# Patient Record
Sex: Female | Born: 1970 | Race: White | Hispanic: No | Marital: Married | Smoking: Never smoker
Health system: Southern US, Community
[De-identification: ages and names within clinical notes are randomized; demographics above are authoritative.]

## PROBLEM LIST (undated history)

## (undated) DIAGNOSIS — K219 Gastro-esophageal reflux disease without esophagitis: Secondary | ICD-10-CM

## (undated) DIAGNOSIS — T7840XA Allergy, unspecified, initial encounter: Secondary | ICD-10-CM

## (undated) DIAGNOSIS — F419 Anxiety disorder, unspecified: Secondary | ICD-10-CM

## (undated) HISTORY — DX: Anxiety disorder, unspecified: F41.9

## (undated) HISTORY — DX: Allergy, unspecified, initial encounter: T78.40XA

## (undated) HISTORY — DX: Gastro-esophageal reflux disease without esophagitis: K21.9

---

## 2021-06-20 ENCOUNTER — Encounter: Payer: Self-pay | Admitting: Family

## 2021-06-20 ENCOUNTER — Other Ambulatory Visit: Payer: Self-pay

## 2021-06-20 ENCOUNTER — Ambulatory Visit: Payer: BC Managed Care – PPO | Admitting: Family

## 2021-06-20 VITALS — BP 139/58 | HR 68 | Temp 98.3°F | Ht 65.75 in | Wt 134.2 lb

## 2021-06-20 DIAGNOSIS — F418 Other specified anxiety disorders: Secondary | ICD-10-CM

## 2021-06-20 DIAGNOSIS — Z1211 Encounter for screening for malignant neoplasm of colon: Secondary | ICD-10-CM

## 2021-06-20 DIAGNOSIS — Z1231 Encounter for screening mammogram for malignant neoplasm of breast: Secondary | ICD-10-CM | POA: Diagnosis not present

## 2021-06-20 DIAGNOSIS — Z79890 Hormone replacement therapy (postmenopausal): Secondary | ICD-10-CM | POA: Insufficient documentation

## 2021-06-20 DIAGNOSIS — N951 Menopausal and female climacteric states: Secondary | ICD-10-CM

## 2021-06-20 DIAGNOSIS — M778 Other enthesopathies, not elsewhere classified: Secondary | ICD-10-CM

## 2021-06-20 DIAGNOSIS — Z23 Encounter for immunization: Secondary | ICD-10-CM

## 2021-06-20 MED ORDER — LORAZEPAM 0.5 MG PO TABS: 0.5000 mg | ORAL_TABLET | Freq: Three times a day (TID) | ORAL | 0 refills | Status: DC

## 2021-06-20 NOTE — Patient Instructions (Signed)
Welcome to [facility]! It was a pleasure meeting you today.  As discussed, Please schedule a physical with fasting labs at your convenience today. See the attached handout on tips to manage your menopause symptoms and elbow tendonitis. Look for a tendon strap at your pharmacy to apply just below the elbow and wear this during the day to take pressure off of the tendon. Apply ice up to 3 times per day for 20-58min at a time. Take generic Aleve or Ibuprofen as needed.    PLEASE NOTE:  If you had any LAB tests please let us know if you have not heard back within a few days. You may see your results on MyChart before we have a chance to review them but we will give you a call once they are reviewed by [place]. If we ordered any REFERRALS today, please let us know if you have not heard from their office within the next week.  Let us know through MyChart if you are needing REFILLS, or have your pharmacy send [place] the request. You can also use MyChart to communicate with me or any office staff.  Please try these tips to maintain a healthy lifestyle:  Eat most of your calories during the day when you are active. Eliminate processed foods including packaged sweets (pies, cakes, cookies), reduce intake of potatoes, white bread, white pasta, and white rice. Look for whole grain options, oat flour or almond flour.  Each meal should contain half fruits/vegetables, one quarter protein, and one quarter carbs (no bigger than a computer mouse).  Cut down on sweet beverages. This includes juice, soda, and sweet tea. Also watch fruit intake, though this is a healthier sweet option, it still contains natural sugar! Limit to 3 servings daily.  Drink at least 1 glass of water with each meal and aim for at least 8 glasses per day  Exercise at least 150 minutes every week.

## 2021-06-20 NOTE — Progress Notes (Signed)
New Patient Office Visit  Subjective:  Patient ID: Carolyn Ferrell, female    DOB: 08-12-1970  Age: 51 y.o. MRN: [id number]  CC:  Chief Complaint  Patient presents with   Establish Care   Menopause    Irregular cycles, night sweats, dizziness. LMP -02/03/2021   Arm Pain    HPI [facility] presents for establishing care and to discuss a new problem.  Menopausal SX:   The patient is not taking hormone replacement therapy.   last mammogram: was normal. Patient is hormone deficient due to perimenopause.  The patient currently has symptoms of hot flashes, night sweats. OTC/herbal treatments tried: none. Pain She reports new onset left elbow pain. There was not an injury that may have caused the pain. The pain started a few months ago and is staying constant. The pain does not radiate. The pain is described as aching and soreness. Symptoms are worse in the: anytime  She has tried NSAIDs with mild relief.  Anxiety/Depression: Patient complains of situational anxiety when flying on airplanes.   She has the following symptoms: feelings of losing control, palpitations, racing thoughts.  Onset of symptoms was approximately  years ago, She denies current suicidal and homicidal ideation.  Previous treatment includes Ativan.  She complains of the following side effects from the treatment:  drowsiness . Depression screen [facility] 2/9 06/20/2021  Decreased Interest 0  Down, Depressed, Hopeless 0  PHQ - 2 Score 0  Altered sleeping 1  Tired, decreased energy 1  Change in appetite 0  Feeling bad or failure about yourself  0  Trouble concentrating 0  Moving slowly or fidgety/restless 0  Suicidal thoughts 0  PHQ-9 Score 2  Difficult doing work/chores Not difficult at all   GAD 7 : Generalized Anxiety Score 06/20/2021  Nervous, Anxious, on Edge 1  Control/stop worrying 0  Worry too much - different things 0  Trouble relaxing 0  Restless 0  Easily annoyed or irritable 1  Afraid - awful  might happen 0  Total GAD 7 Score 2  Anxiety Difficulty Not difficult at all    Past Medical History:  Diagnosis Date   Allergy    GERD (gastroesophageal reflux disease)     History reviewed. No pertinent surgical history.  Family History  Problem Relation Age of Onset   Stroke Mother    Arthritis Mother    Hearing loss Father    Drug abuse Father    Asthma Sister    Mental illness Sister    Drug abuse Brother    Depression Brother    Hyperlipidemia Maternal Grandmother    Heart disease Maternal Grandmother    Early death Maternal Grandmother    Cancer Maternal Grandfather    Hearing loss Paternal Grandmother    Early death Paternal Grandfather    Emphysema Paternal Grandfather     Social History   Socioeconomic History   Marital status: Married    Spouse name: Not on file   Number of children: Not on file   Years of education: Not on file   Highest education level: Not on file  Occupational History   Not on file  Tobacco Use   Smoking status: Not on file   Smokeless tobacco: Not on file  Substance and Sexual Activity   Alcohol use: Not on file   Drug use: Not on file   Sexual activity: Not on file  Other Topics Concern   Not on file  Social History Narrative  Not on file   Social Determinants of Health   Financial Resource Strain: Not on file  Food Insecurity: Not on file  Transportation Needs: Not on file  Physical Activity: Not on file  Stress: Not on file  Social Connections: Not on file  Intimate Partner Violence: Not on file    Objective:   Today's Vitals: BP (!) 139/58    Pulse 68    Temp 98.3 F (36.8 C) (Temporal)    Ht 5' 5.75" (1.67 m) Comment: with shoes   Wt 134 lb 3.2 oz (60.9 kg)    LMP 02/03/2021 (Exact Date)    SpO2 98%    BMI 21.83 kg/m   Physical Exam Vitals and nursing note reviewed.  Constitutional:      Appearance: Normal appearance.  Cardiovascular:     Rate and Rhythm: Normal rate and regular rhythm.  Pulmonary:      Effort: Pulmonary effort is normal.     Breath sounds: Normal breath sounds.  Musculoskeletal:        General: Normal range of motion.  Skin:    General: Skin is warm and dry.  Neurological:     Mental Status: She is alert.  Psychiatric:        Mood and Affect: Mood normal.        Behavior: Behavior normal.   Assessment & Plan:   Problem List Items Addressed This Visit       Musculoskeletal and Integument   Tendonitis of elbow, left    New- no injury, no sports, does not workout, right handed; states she does open/close car door frequently which heavy. Advised on ice tid, ., tendon strap during day, reviewed exercises and handout provided.        Other   Perimenopausal symptom - Primary    reports no menses since 01/2021, states off & on hot flashes & night sweats. Advised pt menopause is 12 mos of no menses, treatment options including OTC black cohosh, Estroven, vs. RX Clonidine, SSRI. pt will try OTC first.      Situational anxiety    Chronic - takes prn when flying      Relevant Medications   LORazepam (ATIVAN) 0.5 MG tablet   Other Visit Diagnoses     Colon cancer screening       Relevant Orders   Ambulatory referral to Gastroenterology   Breast cancer screening by mammogram       Relevant Orders   MM Digital Screening   Need for shingles vaccine       Relevant Orders   Varicella-zoster vaccine IM (Completed)       Outpatient Encounter Medications as of 06/20/2021  Medication Sig   Bacillus Coagulans-Inulin (PROBIOTIC-PREBIOTIC PO) Take by mouth. Gummy   cetirizine (ZYRTEC) 10 MG tablet Take 10 mg by mouth daily.   Misc Natural Products (IMMUNE FORMULA PO) Take by mouth. W/ Elderberry  and Vitamin C   Multiple Vitamin (MULTIVITAMIN) tablet Take 1 tablet by mouth daily.   Multiple Vitamins-Minerals (ENERGY BOOSTER PO) Take by mouth. W/B12 and CoQ10   omeprazole (PRILOSEC) 10 MG capsule Take 10 mg by mouth daily.   [DISCONTINUED] LORazepam (ATIVAN) 0.5  MG tablet Take 0.5 mg by mouth every 8 (eight) hours.   LORazepam (ATIVAN) 0.5 MG tablet Take 1 tablet (0.5 mg total) by mouth every 8 (eight) hours.   No facility-administered encounter medications on file as of 06/20/2021.    Follow-up: Return for Complete physical w/fasting labs.   Shonique Pelphrey,  Judeth Cornfield, NP

## 2021-06-22 ENCOUNTER — Encounter: Payer: Self-pay | Admitting: Family

## 2021-06-22 NOTE — Assessment & Plan Note (Addendum)
New- no injury, no sports, does not workout, right handed; states she does open/close car door frequently which heavy. Advised on ice tid, ., tendon strap during day, reviewed exercises and handout provided.

## 2021-06-22 NOTE — Assessment & Plan Note (Signed)
reports no menses since 01/2021, states off & on hot flashes & night sweats. Advised pt menopause is 12 mos of no menses, treatment options including OTC black cohosh, Estroven, vs. RX Clonidine, SSRI. pt will try OTC first.

## 2021-06-22 NOTE — Assessment & Plan Note (Signed)
Chronic - takes prn when flying

## 2021-08-13 ENCOUNTER — Ambulatory Visit
Admission: RE | Admit: 2021-08-13 | Discharge: 2021-08-13 | Disposition: A | Discharge status: Home / Self Care | Payer: BC Managed Care – PPO | Source: Ambulatory Visit | Attending: Family | Admitting: Family

## 2021-08-13 DIAGNOSIS — Z1231 Encounter for screening mammogram for malignant neoplasm of breast: Secondary | ICD-10-CM

## 2021-09-02 NOTE — Progress Notes (Signed)
Mammogram normal! Recheck in 1 year.

## 2021-10-10 ENCOUNTER — Encounter: Payer: Self-pay | Admitting: Family

## 2021-10-10 ENCOUNTER — Ambulatory Visit (INDEPENDENT_AMBULATORY_CARE_PROVIDER_SITE_OTHER): Payer: BC Managed Care – PPO | Admitting: Family

## 2021-10-10 VITALS — BP 105/74 | HR 75 | Temp 98.2°F | Ht 65.0 in | Wt 137.2 lb

## 2021-10-10 DIAGNOSIS — Z0000 Encounter for general adult medical examination without abnormal findings: Secondary | ICD-10-CM

## 2021-10-10 DIAGNOSIS — Z124 Encounter for screening for malignant neoplasm of cervix: Secondary | ICD-10-CM

## 2021-10-10 DIAGNOSIS — Z23 Encounter for immunization: Secondary | ICD-10-CM | POA: Diagnosis not present

## 2021-10-10 DIAGNOSIS — L918 Other hypertrophic disorders of the skin: Secondary | ICD-10-CM | POA: Diagnosis not present

## 2021-10-10 LAB — CBC WITH DIFFERENTIAL/PLATELET
Basophils Absolute: 0 10*3/uL (ref 0.0–0.1)
Basophils Relative: 0.1 % (ref 0.0–3.0)
Eosinophils Absolute: 0 10*3/uL (ref 0.0–0.7)
Eosinophils Relative: 0.9 % (ref 0.0–5.0)
HCT: 40.8 % (ref 36.0–46.0)
Hemoglobin: 14.1 g/dL (ref 12.0–15.0)
Lymphocytes Relative: 29.6 % (ref 12.0–46.0)
Lymphs Abs: 1.5 10*3/uL (ref 0.7–4.0)
MCHC: 34.5 g/dL (ref 30.0–36.0)
MCV: 90.4 fl (ref 78.0–100.0)
Monocytes Absolute: 0.3 10*3/uL (ref 0.1–1.0)
Monocytes Relative: 6.3 % (ref 3.0–12.0)
Neutro Abs: 3.2 10*3/uL (ref 1.4–7.7)
Neutrophils Relative %: 63.1 % (ref 43.0–77.0)
Platelets: 242 10*3/uL (ref 150.0–400.0)
RBC: 4.52 Mil/uL (ref 3.87–5.11)
RDW: 13.1 % (ref 11.5–15.5)
WBC: 5.1 10*3/uL (ref 4.0–10.5)

## 2021-10-10 LAB — COMPREHENSIVE METABOLIC PANEL
ALT: 22 U/L (ref 0–35)
AST: 27 U/L (ref 0–37)
Albumin: 4.6 g/dL (ref 3.5–5.2)
Alkaline Phosphatase: 87 U/L (ref 39–117)
BUN: 12 mg/dL (ref 6–23)
CO2: 30 mEq/L (ref 19–32)
Calcium: 9.9 mg/dL (ref 8.4–10.5)
Chloride: 102 mEq/L (ref 96–112)
Creatinine, Ser: 0.82 mg/dL (ref 0.40–1.20)
GFR: 83.15 mL/min (ref >60.00)
Glucose, Bld: 82 mg/dL (ref 70–99)
Potassium: 3.8 mEq/L (ref 3.5–5.1)
Sodium: 140 mEq/L (ref 135–145)
Total Bilirubin: 0.6 mg/dL (ref 0.2–1.2)
Total Protein: 7.3 g/dL (ref 6.0–8.3)

## 2021-10-10 LAB — TSH: TSH: 1.96 u[IU]/mL (ref 0.35–5.50)

## 2021-10-10 LAB — LIPID PANEL
Cholesterol: 151 mg/dL (ref 0–200)
HDL: 66.6 mg/dL (ref >39.00)
LDL Cholesterol: 76 mg/dL (ref 0–99)
NonHDL: 84.64
Total CHOL/HDL Ratio: 2
Triglycerides: 42 mg/dL (ref 0.0–149.0)
VLDL: 8.4 mg/dL (ref 0.0–40.0)

## 2021-10-10 NOTE — Patient Instructions (Addendum)
It was very nice to see you today!    Go to the lab for blood work today, I will review via a message on MyChart in a few days.  I have sent referrals to our Gynecology & Dermatology offices and they will call you directly to schedule appointments. Let us know if you do not hear anything within 2 weeks.  Have a great weekend!      PLEASE NOTE:  If you had any lab tests please let us know if you have not heard back within a few days. You may see your results on MyChart before we have a chance to review them but we will give you a call once they are reviewed by [place]. If we ordered any referrals today, please let us know if you have not heard from their office within the next week.

## 2021-10-10 NOTE — Progress Notes (Signed)
Phone [phone]   Subjective:   Patient is a 51 y.o. female presenting for annual physical.    Chief Complaint  Patient presents with   Annual Exam    Not fasting W/ Labs   Skin Tag    Pt states she has a mole on her back. Discuss.     See problem oriented charting- ROS- full  review of systems was completed and negative.   The following were reviewed and entered/updated in epic: Past Medical History:  Diagnosis Date   Allergy    GERD (gastroesophageal reflux disease)    Patient Active Problem List   Diagnosis Date Noted   Perimenopausal symptom 06/20/2021   Tendonitis of elbow, left 06/20/2021   Situational anxiety 06/20/2021   History reviewed. No pertinent surgical history.  Family History  Problem Relation Age of Onset   Stroke Mother    Arthritis Mother    Hearing loss Father    Drug abuse Father    Asthma Sister    Mental illness Sister    Hyperlipidemia Maternal Grandmother    Heart disease Maternal Grandmother    Early death Maternal Grandmother    Cancer Maternal Grandfather    Hearing loss Paternal Grandmother    Early death Paternal Grandfather    Emphysema Paternal Grandfather    Drug abuse Brother    Depression Brother    Breast cancer Neg Hx     Medications- reviewed and updated Current Outpatient Medications  Medication Sig Dispense Refill   cetirizine (ZYRTEC) 10 MG tablet Take 10 mg by mouth daily.     LORazepam (ATIVAN) 0.5 MG tablet Take 1 tablet (0.5 mg total) by mouth every 8 (eight) hours. (Patient taking differently: Take 0.5 mg by mouth as needed.) 30 tablet 0   Misc Natural Products (IMMUNE FORMULA PO) Take by mouth. W/ Elderberry  and Vitamin C     Multiple Vitamin (MULTIVITAMIN) tablet Take 1 tablet by mouth daily.     Multiple Vitamins-Minerals (ENERGY BOOSTER PO) Take by mouth. W/B12 and CoQ10     omeprazole (PRILOSEC) 10 MG capsule Take 10 mg by mouth daily.     Bacillus Coagulans-Inulin (PROBIOTIC-PREBIOTIC PO) Take by  mouth. Gummy (Patient not taking: Reported on 10/10/2021)     No current facility-administered medications for this visit.    Allergies-reviewed and updated No Known Allergies  Social History   Social History Narrative   Not on file   Objective  Objective:  BP 105/74 (BP Location: Left Arm, Patient Position: Sitting, Cuff Size: Large)   Pulse 75   Temp 98.2 F (36.8 C) (Temporal)   Ht 5\' 5"  (1.651 m)   Wt 137 lb 4 oz (62.3 kg)   SpO2 96%   BMI 22.84 kg/m  Physical Exam Vitals and nursing note reviewed.  Constitutional:      Appearance: Normal appearance.  HENT:     Head: Normocephalic.     Right Ear: Tympanic membrane normal.     Left Ear: Tympanic membrane normal.     Nose: Nose normal.     Mouth/Throat:     Mouth: Mucous membranes are moist.  Eyes:     Pupils: Pupils are equal, round, and reactive to light.  Cardiovascular:     Rate and Rhythm: Normal rate and regular rhythm.  Pulmonary:     Effort: Pulmonary effort is normal.     Breath sounds: Normal breath sounds.  Musculoskeletal:        General: Normal range of motion.  Cervical back: Normal range of motion.  Lymphadenopathy:     Cervical: No cervical adenopathy.  Skin:    General: Skin is warm and dry.     Findings: Lesion (several 0.2cm diameter flesh colored skin tags on middle back) present.  Neurological:     Mental Status: She is alert.  Psychiatric:        Mood and Affect: Mood normal.        Behavior: Behavior normal.     Assessment and Plan   Health Maintenance counseling: 1. Anticipatory guidance: Patient counseled regarding regular dental exams q6 months, eye exams,  avoiding smoking and second hand smoke, limiting alcohol to 1 beverage per day, no illicit drugs.   2. Risk factor reduction:  Advised patient of need for regular exercise and diet rich with fruits and vegetables to reduce risk of heart attack and stroke. Exercise- most days.  Wt Readings from Last 3 Encounters:  10/10/21  137 lb 4 oz (62.3 kg)  06/20/21 134 lb 3.2 oz (60.9 kg)   3. Immunizations/screenings/ancillary studies Immunization History  Administered Date(s) Administered   Zoster Recombinat (Shingrix) 06/20/2021, 10/10/2021   Health Maintenance Due  Topic Date Due   HIV Screening  Never done   Hepatitis C Screening  Never done   TETANUS/TDAP  Never done   PAP SMEAR-Modifier  Never done   COLONOSCOPY (Pts 45-107yrs Insurance coverage will need to be confirmed)  Never done    4. Cervical cancer screening- due, pt to sch w/GYN 5. Breast cancer screening-  mammogram - done 08/2021 6. Colon cancer screening - not yet 7. Skin cancer screening- advised regular sunscreen use. Denies worrisome, changing, or new skin lesions.  8. Birth control/STD check- N/A 9. Osteoporosis screening- N/A  10. Alcohol screening:  none 11. Smoking associated screening (lung cancer screening, AAA screen 65-75, UA)- non- smoker  Problem List Items Addressed This Visit   None Visit Diagnoses     Need for shingles vaccine    -  Primary   Relevant Orders   Varicella-zoster vaccine IM (Shingrix) (Completed)   Annual physical exam       Relevant Orders   Comprehensive metabolic panel (Completed)   TSH (Completed)   Lipid panel (Completed)   CBC with Differential/Platelet (Completed)   Acquired skin tag       Relevant Orders   Ambulatory referral to Dermatology   Encounter for Pap smear of cervix with HPV DNA cotesting       Relevant Orders   Ambulatory referral to Gynecology       Recommended follow up:  No follow-ups on file. No future appointments.  Lab/Order associations: fasting   Dulce Sellar, NP

## 2021-10-12 NOTE — Progress Notes (Signed)
Hi Latronda,  All of your labs are within normal range.  Glucose (sugar), electrolytes, kidney and liver function, blood count, thyroid, and cholesterol numbers are all good.   Keep up the good work with controlling your diet and continue to try and shoot for 30 minutes of exercise daily!

## 2021-10-31 ENCOUNTER — Ambulatory Visit (INDEPENDENT_AMBULATORY_CARE_PROVIDER_SITE_OTHER): Payer: BC Managed Care – PPO | Admitting: Obstetrics and Gynecology

## 2021-10-31 ENCOUNTER — Encounter: Payer: Self-pay | Admitting: Obstetrics and Gynecology

## 2021-10-31 VITALS — BP 122/78 | HR 66 | Ht 64.5 in | Wt 137.0 lb

## 2021-10-31 DIAGNOSIS — Z1211 Encounter for screening for malignant neoplasm of colon: Secondary | ICD-10-CM | POA: Diagnosis not present

## 2021-10-31 DIAGNOSIS — Z1159 Encounter for screening for other viral diseases: Secondary | ICD-10-CM | POA: Diagnosis not present

## 2021-10-31 DIAGNOSIS — N926 Irregular menstruation, unspecified: Secondary | ICD-10-CM | POA: Diagnosis not present

## 2021-10-31 DIAGNOSIS — Z01419 Encounter for gynecological examination (general) (routine) without abnormal findings: Secondary | ICD-10-CM

## 2021-10-31 DIAGNOSIS — N951 Menopausal and female climacteric states: Secondary | ICD-10-CM | POA: Diagnosis not present

## 2021-11-03 LAB — FOLLICLE STIMULATING HORMONE: FSH: 61.6 m[IU]/mL

## 2021-11-03 LAB — ESTRADIOL: Estradiol: 36 pg/mL

## 2021-11-03 LAB — HEPATITIS C ANTIBODY: Hepatitis C Ab: NONREACTIVE

## 2021-11-10 DIAGNOSIS — Z1211 Encounter for screening for malignant neoplasm of colon: Secondary | ICD-10-CM | POA: Diagnosis not present

## 2021-11-16 LAB — COLOGUARD: COLOGUARD: NEGATIVE

## 2022-01-30 ENCOUNTER — Encounter: Payer: Self-pay | Admitting: Obstetrics and Gynecology

## 2022-01-30 ENCOUNTER — Encounter: Payer: Self-pay | Admitting: Family

## 2022-01-30 DIAGNOSIS — L918 Other hypertrophic disorders of the skin: Secondary | ICD-10-CM

## 2022-02-02 ENCOUNTER — Encounter: Payer: Self-pay | Admitting: *Deleted

## 2022-02-18 ENCOUNTER — Ambulatory Visit: Payer: BC Managed Care – PPO | Admitting: Obstetrics and Gynecology

## 2022-02-18 ENCOUNTER — Encounter: Payer: Self-pay | Admitting: Obstetrics and Gynecology

## 2022-02-18 VITALS — BP 134/78 | Resp 14 | Ht 64.0 in | Wt 137.0 lb

## 2022-02-18 DIAGNOSIS — N951 Menopausal and female climacteric states: Secondary | ICD-10-CM

## 2022-02-18 NOTE — Patient Instructions (Signed)
Estradiol Skin Patches What is this medication? ESTRADIOL (es tra DYE ole) reduces the number and severity of hot flashes due to menopause. It may also help relieve the symptoms of menopause, such as vaginal irritation, dryness, or pain during sex. It can also be used to prevent osteoporosis after menopause. It works by increasing levels of the hormone estrogen in the body. This medication is an estrogen hormone. This medicine may be used for other purposes; ask your health care provider or pharmacist if you have questions. COMMON BRAND NAME(S): Alora, Climara, DOTTI, Esclim, Estraderm, FemPatch, LYLLANA, Menostar, Minivelle, Vivelle, Vivelle-Dot What should I tell my care team before I take this medication? They need to know if you have any of these conditions: Abnormal vaginal bleeding Blood vessel disease or blood clots Breast, cervical, endometrial, ovarian, liver, or uterine cancer Dementia Diabetes (high blood sugar) Gallbladder disease Heart disease or recent heart attack High blood pressure High cholesterol High levels of calcium in the blood Hysterectomy Kidney disease Liver disease Low thyroid levels Lupus Migraine headaches Protein C/S deficiency Smoke tobacco cigarettes Stroke An unusual or allergic reaction to estrogens, other medications, foods, dyes, or preservatives Pregnant or trying to get pregnant Breast-feeding How should I use this medication? This medication is for external use only. Use it as directed on the prescription label. Apply the patch, sticky side to the skin, to an area that is clean, dry and hairless. Do not cut or trim the patch. Do not wear more than 1 patch at a time. Remove the old patch before using a new patch. Use a different site each time to prevent skin irritation. Keep using it unless your care team tells you to stop. This medication comes with INSTRUCTIONS FOR USE. Ask your pharmacist for directions on how to use this medication. Read the  information carefully. Talk to your pharmacist or care team if you have questions. A patient package insert for the product will be given with each prescription and refill. Be sure to read this information carefully each time. Talk to your care team about the use of this medication in children. Special care may be needed. Overdosage: If you think you have taken too much of this medicine contact a poison control center or emergency room at once. NOTE: This medicine is only for you. Do not share this medicine with others. What if I miss a dose? If you miss a dose, apply it as soon as you can. If it is almost time for your next dose, apply only that dose. Do not apply double or extra doses. What may interact with this medication? Do not take this medication with any of the following: Aromatase inhibitors like aminoglutethimide, anastrozole, exemestane, letrozole, testolactone This medication may also interact with the following: Carbamazepine Certain antibiotics like erythromycin or clarithromycin Certain antiviral medications for HIV or hepatitis Certain medications for fungal infections like ketoconazole, itraconazole, or posaconazole Medications for fungus infections like itraconazole and ketoconazole Phenobarbital Raloxifene Rifampin St. John's Wort Tamoxifen This list may not describe all possible interactions. Give your health care provider a list of all the medicines, herbs, non-prescription drugs, or dietary supplements you use. Also tell them if you smoke, drink alcohol, or use illegal drugs. Some items may interact with your medicine. What should I watch for while using this medication? Visit your care team for regular checks on your progress. You will need a regular breast and pelvic exam and Pap smear while on this medication. You should also discuss the need for regular  mammograms with your care team, and follow his or her guidelines for these tests. This medication can make your  body retain fluid, making your fingers, hands, or ankles swell. Your blood pressure can go up. Contact your care team if you feel you are retaining fluid. If you have any reason to think you are pregnant, stop taking this medication right away and contact your care team. Smoking increases the risk of getting a blood clot or having a stroke while you are taking this medication, especially if you are more than 51 years old. You are strongly advised not to smoke. If you wear contact lenses and notice visual changes, or if the lenses begin to feel uncomfortable, consult your eye care specialist. This medication can increase the risk of developing a condition (endometrial hyperplasia) that may lead to cancer of the lining of the uterus. Taking progestins, another hormone medication, with this medication lowers the risk of developing this condition. Therefore, if your uterus has not been removed (by a hysterectomy), your care team may prescribe a progestin for you to take together with your estrogen. You should know, however, that taking estrogens with progestins may have additional health risks. You should discuss the use of estrogens and progestins with your care team to determine the benefits and risks for you. If you are going to need surgery, an MRI, CT scan, or other procedure, tell your care team that you are using this medication. You may need to remove the patch before the procedure. Contact with water while you are swimming, using a sauna, bathing, or showering may cause the patch to fall off. If your patch falls off reapply it. If you cannot reapply the patch, apply a new patch to another area and continue to follow your usual dose schedule. What side effects may I notice from receiving this medication? Side effects that you should report to your care team as soon as possible: Allergic reactions--skin rash, itching, hives, swelling of the face, lips, tongue, or throat Blood clot--pain, swelling, or  warmth in the leg, shortness of breath, chest pain Breast tissue changes, new lumps, redness, pain, or discharge from the nipple Gallbladder problems--severe stomach pain, nausea, vomiting, fever Increase in blood pressure Liver injury--right upper belly pain, loss of appetite, nausea, light-colored stool, dark yellow or brown urine, yellowing skin or eyes, unusual weakness or fatigue Stroke--sudden numbness or weakness of the face, arm, or leg, trouble speaking, confusion, trouble walking, loss of balance or coordination, dizziness, severe headache, change in vision Unusual vaginal discharge, itching, or odor Vaginal bleeding after menopause, pelvic pain Side effects that usually do not require medical attention (report to your care team if they continue or are bothersome): Bloating Breast pain or tenderness Hair loss Nausea Stomach pain Vomiting This list may not describe all possible side effects. Call your doctor for medical advice about side effects. You may report side effects to FDA at 1-800-FDA-1088. Where should I keep my medication? Keep out of the reach of children and pets. Store at room temperature between 20 and 25 degrees C (68 and 77 degrees F). Keep this medication in the original pouch until you are ready to use it. Get rid of any unused medication after the expiration date. Get rid of used patches properly. Since used patches may still contain active medication, fold the patch in half so that it sticks to itself before throwing it away. Put it in the trash where children and pets cannot reach it. It is important to get  rid of the medication as soon as you no longer need it, or it is expired. You can do this in two ways: Take the medication to a medication take-back program. Check with your pharmacy or law enforcement to find a location. If you cannot return the medication, ask your pharmacist or care team how to get rid of this medication safely. NOTE: This sheet is a  summary. It may not cover all possible information. If you have questions about this medicine, talk to your doctor, pharmacist, or health care provider.  2023 Elsevier/Gold Standard (2004-07-01 00:00:00)    Progesterone Capsules - Prometrium What is this medication? PROGESTERONE (proe JES ter one) prevents the lining of the uterus from becoming too thick in people taking estrogen after menopause. It may also be used to treat irregular menstrual cycles. It works by increasing levels of the hormone progesterone in your body. This medication is a progestin hormone. This medicine may be used for other purposes; ask your health care provider or pharmacist if you have questions. COMMON BRAND NAME(S): PROMETRIUM What should I tell my care team before I take this medication? They need to know if you have any of these conditions: Autoimmune disease like systemic lupus erythematosus (SLE) Blood vessel disease, blood clotting disorder, or suffered a stroke Breast, cervical or vaginal cancer Dementia Diabetes Kidney or liver disease Heart disease, high blood pressure or recent heart attack High blood lipids or cholesterol Hysterectomy Recent miscarriage Tobacco smoker Vaginal bleeding An unusual or allergic reaction to progesterone, peanuts, other medications, foods, dyes, or preservatives Pregnant or trying to get pregnant Breast-feeding How should I use this medication? Take this medication by mouth with a glass of water. Follow the directions on the prescription label. Take your doses at regular intervals. Do not take your medication more often than directed. Talk to your care team about the use of this medication in children. Special care may be needed. A patient package insert for the product will be given with each prescription and refill. Read this sheet carefully each time. The sheet may change frequently. Overdosage: If you think you have taken too much of this medicine contact a poison  control center or emergency room at once. NOTE: This medicine is only for you. Do not share this medicine with others. What if I miss a dose? If you miss a dose, take it as soon as you can. If it is almost time for your next dose, take only that dose. Do not take double or extra doses. What may interact with this medication? Do not take this medication with any of the following: Bosentan This medication may also interact with the following: Barbiturate medications for sleep or seizures Bexarotene Carbamazepine Ethotoin Ketoconazole Phenytoin Rifampin This list may not describe all possible interactions. Give your health care provider a list of all the medicines, herbs, non-prescription drugs, or dietary supplements you use. Also tell them if you smoke, drink alcohol, or use illegal drugs. Some items may interact with your medicine. What should I watch for while using this medication? Visit your care team for regular checks on your progress. This medication can cause swelling, tenderness, or bleeding of the gums. Be careful when brushing and flossing teeth. See your dentist regularly for routine dental care. You may get drowsy or dizzy. Do not drive, use machinery, or do anything that needs mental alertness until you know how this medication affects you. Do not stand or sit up quickly, especially if you are an older patient. This  reduces the risk of dizzy or fainting spells. What side effects may I notice from receiving this medication? Side effects that you should report to your care team as soon as possible: Allergic reactions--skin rash, itching, hives, swelling of the face, lips, tongue, or throat Blood clot--pain, swelling, or warmth in the leg, shortness of breath, chest pain Breast tissue changes, new lumps, redness, pain, or discharge from the nipple Liver injury--right upper belly pain, loss of appetite, nausea, light-colored stool, dark yellow or brown urine, yellowing skin or eyes,  unusual weakness or fatigue New or worsening migraines or headaches Stroke--sudden numbness or weakness of the face, arm, or leg, trouble speaking, confusion, trouble walking, loss of balance or coordination, dizziness, severe headache, change in vision Sudden eye pain or change in vision such as blurry vision, seeing halos around lights, vision loss Unusual vaginal discharge, itching, or odor Worsening mood, feelings of depression Side effects that usually do not require medical attention (report to your care team if they continue or are bothersome): Bloating Breast pain or tenderness Dizziness Drowsiness Headache Irregular menstrual cycles or spotting Mood swings Nausea Swelling of the ankles, hands, or feet This list may not describe all possible side effects. Call your doctor for medical advice about side effects. You may report side effects to FDA at 1-800-FDA-1088. Where should I keep my medication? Keep out of the reach of children. Store at room temperature between 15 and 30 degrees C (59 and 86 degrees F). Protect from light. Keep container tightly closed. Throw away any unused medication after the expiration date. NOTE: This sheet is a summary. It may not cover all possible information. If you have questions about this medicine, talk to your doctor, pharmacist, or health care provider.  2023 Elsevier/Gold Standard (2020-07-12 00:00:00)

## 2022-02-18 NOTE — Progress Notes (Signed)
Opened in error

## 2022-02-18 NOTE — Progress Notes (Signed)
GYNECOLOGY  VISIT   HPI: 51 y.o.   Married  Caucasian  female   [id number] with Patient's last menstrual period was 10/31/2021 (approximate).   here for menopausal symptoms: anxiety, hot flashes, trouble sleeping,headaches, vaginal dryness.   LMP June, 2023.  I noted a small amount of bleeding at the time of her routine annual exam visit on 10/31/21.   FSH 61.6 and E2 36 on that day.   Tried Amberen and had upset stomach as a side effect.  Takes Ativan to help her sleep.   GYNECOLOGIC HISTORY: Patient's last menstrual period was 10/31/2021 (approximate). Contraception:  vasectomy.  Menopausal hormone therapy:  n/a Last mammogram:  08/13/2021 BI-RADS CATEGORY  1: Negative. Last pap smear:  2021 Pt.states normal         OB History     Gravida  2   Para  2   Term  2   Preterm      AB      Living  2      SAB      IAB      Ectopic      Multiple      Live Births                 Patient Active Problem List   Diagnosis Date Noted   Perimenopausal symptom 06/20/2021   Tendonitis of elbow, left 06/20/2021   Situational anxiety 06/20/2021    Past Medical History:  Diagnosis Date   Allergy    GERD (gastroesophageal reflux disease)     History reviewed. No pertinent surgical history.  Current Outpatient Medications  Medication Sig Dispense Refill   Bacillus Coagulans-Inulin (PROBIOTIC-PREBIOTIC PO) Take by mouth. Gummy     cetirizine (ZYRTEC) 10 MG tablet Take 10 mg by mouth daily.     LORazepam (ATIVAN) 0.5 MG tablet Take 1 tablet (0.5 mg total) by mouth every 8 (eight) hours. (Patient taking differently: Take 0.5 mg by mouth as needed.) 30 tablet 0   Misc Natural Products (IMMUNE FORMULA PO) Take by mouth. W/ Elderberry  and Vitamin C     Multiple Vitamin (MULTIVITAMIN) tablet Take 1 tablet by mouth daily.     Multiple Vitamins-Minerals (ENERGY BOOSTER PO) Take by mouth. W/B12 and CoQ10     omeprazole (PRILOSEC) 10 MG capsule Take 10 mg by mouth daily.      No current facility-administered medications for this visit.     ALLERGIES: Patient has no known allergies.  Family History  Problem Relation Age of Onset   Stroke Mother    Arthritis Mother    Drug abuse Father    Emphysema Father    Asthma Sister    Mental illness Sister    Drug abuse Brother    Depression Brother    Hypertension Maternal Grandmother    Heart disease Maternal Grandmother    Early death Maternal Grandmother    Cancer Maternal Grandfather        Throat   Hearing loss Paternal Grandmother    Early death Paternal Grandfather    Emphysema Paternal Grandfather    Breast cancer Neg Hx     Social History   Socioeconomic History   Marital status: Married    Spouse name: Not on file   Number of children: Not on file   Years of education: Not on file   Highest education level: Not on file  Occupational History   Not on file  Tobacco Use   Smoking status: Never  Smokeless tobacco: Never  Vaping Use   Vaping Use: Never used  Substance and Sexual Activity   Alcohol use: Never   Drug use: Never   Sexual activity: Yes    Partners: Male    Comment: Vasectomy  Other Topics Concern   Not on file  Social History Narrative   Not on file   Social Determinants of Health   Financial Resource Strain: Not on file  Food Insecurity: Not on file  Transportation Needs: Not on file  Physical Activity: Not on file  Stress: Not on file  Social Connections: Not on file  Intimate Partner Violence: Not on file    Review of Systems  Endocrine: Positive for heat intolerance.  Neurological:  Positive for headaches.  Psychiatric/Behavioral:  Positive for sleep disturbance.        Anxiety    PHYSICAL EXAMINATION:    BP 134/78 (BP Location: Left Arm, Patient Position: Sitting, Cuff Size: Normal)   Resp 14   Ht 5\' 4"  (1.626 m)   Wt 137 lb (62.1 kg)   LMP 10/31/2021 (Approximate)   BMI 23.52 kg/m     General appearance: alert, cooperative and appears stated  age  ASSESSMENT  Menopausal symptoms.  Perimenopausal by FSH/estradiol drawn in June, 2023.   PLAN  We had a comprehensive discussion about menopause and treatment options.  We reviewed HRT including different formulations, Paxil, Effexor, gabapentin, Veozah, and herbal therapies.  Risks and benefits of each reviewed.  Discused WHI and use of HRT which can increase risk of PE, DVT, MI, stroke and breast cancer.  We also reviewed treatment for vaginal atrophy including lubricants, vitamin E, and vaginal estrogens.  Brochure on menopause.  Patient will do additional research and let me know if she would like to do a particular treatment option or combination.    An After Visit Summary was printed and given to the patient.  35 min  total time was spent for this patient encounter, including preparation, face-to-face counseling with the patient, coordination of care, and documentation of the encounter.

## 2022-04-23 ENCOUNTER — Encounter: Payer: Self-pay | Admitting: *Deleted

## 2022-05-08 ENCOUNTER — Other Ambulatory Visit: Payer: Self-pay | Admitting: Family

## 2022-05-08 DIAGNOSIS — F418 Other specified anxiety disorders: Secondary | ICD-10-CM

## 2022-05-08 MED ORDER — LORAZEPAM 0.5 MG PO TABS: 0.5000 mg | ORAL_TABLET | Freq: Three times a day (TID) | ORAL | 0 refills | Status: DC

## 2022-08-31 ENCOUNTER — Encounter: Payer: Self-pay | Admitting: Obstetrics and Gynecology

## 2022-09-01 NOTE — Telephone Encounter (Signed)
Patient was seen in office on 02/18/22, options discussed.   Routing to Dr. Edward Jolly to review and advise.

## 2022-09-01 NOTE — Telephone Encounter (Signed)
Carolyn Salles, MD  You30 minutes ago (1:16 PM)    Needs office visit. Last visit was 6 months ago.  I am happy to help.  Conley Simmonds, MD  Spoke with patient. Patient states she was not aware there was a time limit since she was current on her AEX. States she currently is selp pay, declines additional OV or MyChart visit at this time. AEX scheduled for 11/25/22 at 0930. States she will plan to further discuss at AEX.   Routing to provider for final review. Patient is agreeable to disposition. Will close encounter.

## 2022-09-22 ENCOUNTER — Encounter: Payer: Self-pay | Admitting: Dermatology

## 2022-09-22 ENCOUNTER — Ambulatory Visit (INDEPENDENT_AMBULATORY_CARE_PROVIDER_SITE_OTHER): Payer: Self-pay | Admitting: Dermatology

## 2022-09-22 VITALS — BP 132/82 | HR 64

## 2022-09-22 DIAGNOSIS — W908XXA Exposure to other nonionizing radiation, initial encounter: Secondary | ICD-10-CM

## 2022-09-22 DIAGNOSIS — L821 Other seborrheic keratosis: Secondary | ICD-10-CM

## 2022-09-22 DIAGNOSIS — L578 Other skin changes due to chronic exposure to nonionizing radiation: Secondary | ICD-10-CM

## 2022-09-22 DIAGNOSIS — X32XXXA Exposure to sunlight, initial encounter: Secondary | ICD-10-CM

## 2022-09-22 DIAGNOSIS — D225 Melanocytic nevi of trunk: Secondary | ICD-10-CM

## 2022-09-22 DIAGNOSIS — L858 Other specified epidermal thickening: Secondary | ICD-10-CM

## 2022-09-22 DIAGNOSIS — D229 Melanocytic nevi, unspecified: Secondary | ICD-10-CM

## 2022-09-22 DIAGNOSIS — L814 Other melanin hyperpigmentation: Secondary | ICD-10-CM

## 2022-09-22 DIAGNOSIS — Z1283 Encounter for screening for malignant neoplasm of skin: Secondary | ICD-10-CM

## 2022-09-22 NOTE — Patient Instructions (Addendum)
Counseling for BBL / IPL / Laser and Coordination of Care Discussed the treatment option of Broad Band Light (BBL) /Intense Pulsed Light (IPL)/ Laser for skin discoloration, including brown spots and redness.  Typically we recommend at least 1-3 treatment sessions about 5-8 weeks apart for best results.  Cannot have tanned skin when BBL performed, and regular use of sunscreen is advised after the procedure to help maintain results. The patient's condition may also require "maintenance treatments" in the future.  The fee for BBL / laser treatments is $350 per treatment session for the whole face.  A fee can be quoted for other parts of the body.  Insurance typically does not pay for BBL/laser treatments and therefore the fee is an out-of-pocket cost.     Keratosis Pilaris at arms if desired, patient can use an emollient (moisturizer) containing ammonium lactate (AmLactin), urea or salicylic acid once a day to smooth the area  Recommend starting moisturizer with exfoliant (Urea, Salicylic acid, or Lactic acid) one to two times daily to help smooth rough and bumpy skin.  OTC options include Cetaphil Rough and Bumpy lotion (Urea), Eucerin Roughness Relief lotion or spot treatment cream (Urea), CeraVe SA lotion/cream for Rough and Bumpy skin (Sal Acid), Gold Bond Rough and Bumpy cream (Sal Acid), and AmLactin 12% lotion/cream (Lactic Acid).  If applying in morning, also apply sunscreen to sun-exposed areas, since these exfoliating moisturizers can increase sensitivity to sun.   Recommend starting moisturizer with exfoliant (Urea, Salicylic acid, or Lactic acid) one to two times daily to help smooth rough and bumpy skin.  OTC options include Cetaphil Rough and Bumpy lotion (Urea), Eucerin Roughness Relief lotion or spot treatment cream (Urea), CeraVe SA lotion/cream for Rough and Bumpy skin (Sal Acid), Gold Bond Rough and Bumpy cream (Sal Acid), and AmLactin 12% lotion/cream (Lactic Acid).  If applying in  morning, also apply sunscreen to sun-exposed areas, since these exfoliating moisturizers can increase sensitivity to sun.     Melanoma ABCDEs  Melanoma is the most dangerous type of skin cancer, and is the leading cause of death from skin disease.  You are more likely to develop melanoma if you: Have light-colored skin, light-colored eyes, or red or blond hair Spend a lot of time in the sun Tan regularly, either outdoors or in a tanning bed Have had blistering sunburns, especially during childhood Have a close family member who has had a melanoma Have atypical moles or large birthmarks  Early detection of melanoma is key since treatment is typically straightforward and cure rates are extremely high if we catch it early.   The first sign of melanoma is often a change in a mole or a new dark spot.  The ABCDE system is a way of remembering the signs of melanoma.  A for asymmetry:  The two halves do not match. B for border:  The edges of the growth are irregular. C for color:  A mixture of colors are present instead of an even brown color. D for diameter:  Melanomas are usually (but not always) greater than 6mm - the size of a pencil eraser. E for evolution:  The spot keeps changing in size, shape, and color.  Please check your skin once per month between visits. You can use a small mirror in front and a large mirror behind you to keep an eye on the back side or your body.   If you see any new or changing lesions before your next follow-up, please call to schedule  a visit.  Please continue daily skin protection including broad spectrum sunscreen SPF 30+ to sun-exposed areas, reapplying every 2 hours as needed when you're outdoors.   Staying in the shade or wearing long sleeves, sun glasses (UVA+UVB protection) and wide brim hats (4-inch brim around the entire circumference of the hat) are also recommended for sun protection.    Due to recent changes in healthcare laws, you may see  results of your pathology and/or laboratory studies on MyChart before the doctors have had a chance to review them. We understand that in some cases there may be results that are confusing or concerning to you. Please understand that not all results are received at the same time and often the doctors may need to interpret multiple results in order to provide you with the best plan of care or course of treatment. Therefore, we ask that you please give [place] 2 business days to thoroughly review all your results before contacting the office for clarification. Should we see a critical lab result, you will be contacted sooner.   If You Need Anything After Your Visit  If you have any questions or concerns for your doctor, please call our main line at [phone] and press option 4 to reach your doctor's medical assistant. If no one answers, please leave a voicemail as directed and we will return your call as soon as possible. Messages left after 4 pm will be answered the following business day.   You may also send [place] a message via MyChart. We typically respond to MyChart messages within 1-2 business days.  For prescription refills, please ask your pharmacy to contact our office. Our fax number is [phone].  If you have an urgent issue when the clinic is closed that cannot wait until the next business day, you can page your doctor at the number below.    Please note that while we do our best to be available for urgent issues outside of office hours, we are not available 24/7.   If you have an urgent issue and are unable to reach [place], you may choose to seek medical care at your doctor's office, retail clinic, urgent care center, or emergency room.  If you have a medical emergency, please immediately call 911 or go to the emergency department.  Pager Numbers  - Dr. Gwen Pounds: [phone]  - Dr. Neale Burly: [phone]  - Dr. Roseanne Reno: [phone]  In the event of inclement weather, please call our main  line at [phone] for an update on the status of any delays or closures.  Dermatology Medication Tips: Please keep the boxes that topical medications come in in order to help keep track of the instructions about where and how to use these. Pharmacies typically print the medication instructions only on the boxes and not directly on the medication tubes.   If your medication is too expensive, please contact our office at [phone] option 4 or send [place] a message through MyChart.   We are unable to tell what your co-pay for medications will be in advance as this is different depending on your insurance coverage. However, we may be able to find a substitute medication at lower cost or fill out paperwork to get insurance to cover a needed medication.   If a prior authorization is required to get your medication covered by your insurance company, please allow [place] 1-2 business days to complete this process.  Drug prices often vary depending on where the prescription is filled and some pharmacies may offer  cheaper prices.  The website www.goodrx.com contains coupons for medications through different pharmacies. The prices here do not account for what the cost may be with help from insurance (it may be cheaper with your insurance), but the website can give you the price if you did not use any insurance.  - You can print the associated coupon and take it with your prescription to the pharmacy.  - You may also stop by our office during regular business hours and pick up a GoodRx coupon card.  - If you need your prescription sent electronically to a different pharmacy, notify our office through [facility] or by phone at [phone] option 4.     Si Usted Necesita Algo Despus de Su Visita  Tambin puede enviarnos un mensaje a travs de Clinical cytogeneticist. Por lo general respondemos a los mensajes de MyChart en el transcurso de 1 a 2 das hbiles.  Para renovar recetas, por favor pida a su farmacia que  se ponga en contacto con nuestra oficina. Annie Sable de fax es [place] [phone].  Si tiene un asunto urgente cuando la clnica est cerrada y que no puede esperar hasta el siguiente da hbil, puede llamar/localizar a su doctor(a) al nmero que aparece a continuacin.   Por favor, tenga en cuenta que aunque hacemos todo lo posible para estar disponibles para asuntos urgentes fuera del horario de [place], no estamos disponibles las 24 horas del da, los 7 [address]  [address] de la [place].   Si tiene un problema urgente y no puede comunicarse con nosotros, puede optar por buscar atencin mdica  en el consultorio de su doctor(a), en una clnica privada, en un centro de atencin urgente o en una sala de emergencias.  Si tiene Engineer, drilling, por favor llame inmediatamente al 911 o vaya a la sala de emergencias.  Nmeros de bper  - Dr. Gwen Pounds: [phone]  - Dra. Moye: [phone]  - Dra. Roseanne Reno: [phone]  En caso de inclemencias del [place], por favor llame a Lacy Duverney principal al [phone] para una actualizacin sobre el [place] de cualquier retraso o cierre.  Consejos para la medicacin en dermatologa: Por favor, guarde las cajas en las que vienen los medicamentos de uso tpico para ayudarle a seguir las instrucciones sobre dnde y cmo usarlos. Las farmacias generalmente imprimen las instrucciones del medicamento slo en las cajas y no directamente en los tubos del [place].   Si su medicamento es muy caro, por favor, pngase en contacto con Rolm Gala llamando al [phone] y presione la opcin 4 o envenos un mensaje a travs de Clinical cytogeneticist.   No podemos decirle cul ser su copago por los medicamentos por adelantado ya que esto es diferente dependiendo de la cobertura de su seguro. Sin embargo, es posible que podamos encontrar un medicamento sustituto a Audiological scientist un formulario para que el seguro cubra el medicamento que se considera necesario.   Si se  requiere una autorizacin previa para que su compaa de seguros [place] su medicamento, por favor permtanos de 1 a 2 das hbiles para completar [address]  Los precios de los medicamentos varan con frecuencia dependiendo del Environmental consultant de dnde se surte la receta y alguna farmacias pueden ofrecer precios ms baratos.  El sitio web www.goodrx.com tiene cupones para medicamentos de Health and safety inspector. Los precios aqu no tienen en cuenta lo que podra costar con la ayuda del seguro (puede ser ms barato con su seguro), pero el sitio web puede darle el precio si no utiliz ningn  seguro.  - Puede imprimir el cupn correspondiente y llevarlo con su receta a la farmacia.  - Tambin puede pasar por nuestra oficina durante el horario de atencin regular y Charity fundraiser una tarjeta de cupones de GoodRx.  - Si necesita que su receta se enve electrnicamente a una farmacia diferente, informe a nuestra oficina a travs de MyChart de [place] o por telfono llamando al [phone] y presione la opcin 4.

## 2022-09-22 NOTE — Progress Notes (Signed)
New Patient Visit   Subjective  Carolyn Ferrell is a 52 y.o. female who presents for the following: Skin Cancer Screening and Full Body Skin Exam Reports history of itchy place at mid back, has since went away. Patient states scratched off some time ago. Has not returned.  The patient presents for Total-Body Skin Exam (TBSE) for skin cancer screening and mole check. The patient has spots, moles and lesions to be evaluated, some may be new or changing and the patient has concerns that these could be cancer.    The following portions of the chart were reviewed this encounter and updated as appropriate: medications, allergies, medical history  Review of Systems:  No other skin or systemic complaints except as noted in HPI or Assessment and Plan.  Objective  Well appearing patient in no apparent distress; mood and affect are within normal limits.  A full examination was performed including scalp, head, eyes, ears, nose, lips, neck, chest, axillae, abdomen, back, buttocks, bilateral upper extremities, bilateral lower extremities, hands, feet, fingers, toes, fingernails, and toenails. All findings within normal limits unless otherwise noted below.   Relevant physical exam findings are noted in the Assessment and Plan.    Assessment & Plan   LENTIGINES, SEBORRHEIC KERATOSES, HEMANGIOMAS - Benign normal skin lesions - Benign-appearing - Call for any changes  For Lentigines Counseling for BBL / IPL / Laser and Coordination of Care Discussed the treatment option of Broad Band Light (BBL) /Intense Pulsed Light (IPL)/ Laser for skin discoloration, including brown spots and redness.  Typically we recommend at least 1-3 treatment sessions about 5-8 weeks apart for best results.  Cannot have tanned skin when BBL performed, and regular use of sunscreen is advised after the procedure to help maintain results. The patient's condition may also require "maintenance treatments" in the future.  The  fee for BBL / laser treatments is $350 per treatment session for the whole face.  A fee can be quoted for other parts of the body.  Insurance typically does not pay for BBL/laser treatments and therefore the fee is an out-of-pocket cost.    KERATOSIS PILARIS - Tiny follicular keratotic papules - Benign. Genetic in nature. No cure. - Observe. - If desired, patient can use an emollient (moisturizer) containing ammonium lactate (AmLactin), urea or salicylic acid once a day to smooth the area  Recommend starting moisturizer with exfoliant (Urea, Salicylic acid, or Lactic acid) one to two times daily to help smooth rough and bumpy skin.  OTC options include Cetaphil Rough and Bumpy lotion (Urea), Eucerin Roughness Relief lotion or spot treatment cream (Urea), CeraVe SA lotion/cream for Rough and Bumpy skin (Sal Acid), Gold Bond Rough and Bumpy cream (Sal Acid), and AmLactin 12% lotion/cream (Lactic Acid).  If applying in morning, also apply sunscreen to sun-exposed areas, since these exfoliating moisturizers can increase sensitivity to sun.   MELANOCYTIC NEVI - Tan-brown and/or pink-flesh-colored symmetric macules and papules - Benign appearing on exam today - Observation - Call clinic for new or changing moles - Recommend daily use of broad spectrum spf 30+ sunscreen to sun-exposed areas.  Nevus  2 mm medium dark brown macule on right mid back at braline   Benign-appearing.  Observation.  Call clinic for new or changing lesions.  Recommend daily use of broad spectrum spf 30+ sunscreen to sun-exposed areas.    ACTINIC DAMAGE - Chronic condition, secondary to cumulative UV/sun exposure - diffuse scaly erythematous macules with underlying dyspigmentation - Recommend daily broad spectrum sunscreen  SPF 30+ to sun-exposed areas, reapply every 2 hours as needed.  - Staying in the shade or wearing long sleeves, sun glasses (UVA+UVB protection) and wide brim hats (4-inch brim around the entire  circumference of the hat) are also recommended for sun protection.  - Call for new or changing lesions.  SKIN CANCER SCREENING PERFORMED TODAY.   Return if symptoms worsen or fail to improve.  I, Asher Muir, CMA, am acting as scribe for Willeen Niece, MD.   Documentation: I have reviewed the above documentation for accuracy and completeness, and I agree with the above.  Willeen Niece, MD

## 2022-10-15 ENCOUNTER — Other Ambulatory Visit: Payer: Self-pay | Admitting: Family

## 2022-10-15 DIAGNOSIS — F418 Other specified anxiety disorders: Secondary | ICD-10-CM

## 2022-10-18 MED ORDER — LORAZEPAM 0.5 MG PO TABS: 0.5000 mg | ORAL_TABLET | Freq: Three times a day (TID) | ORAL | 0 refills | Status: DC

## 2022-11-18 NOTE — Progress Notes (Deleted)
52 y.o. G76P2002 Married Caucasian female here for annual exam.    PCP:     No LMP recorded. Patient is perimenopausal.           Sexually active: {yes no:314532}  The current method of family planning is perimenopause.    Exercising: {yes no:314532}  {types:19826} Smoker:  no  Health Maintenance: Pap:  2021 pt states normal History of abnormal Pap:  yes, in remote past and had retesting which was normal.  MMG:  08/13/21 Breast Density Cat D, BI-RADS CAT 1 neg Colonoscopy:  n/a BMD:   n/a  Result  n/a TDaP:  unsure Gardasil:   no HIV: neg Hep C: n/a Screening Labs:  Hb today: ***, Urine today: ***   reports that she has never smoked. She has never used smokeless tobacco. She reports that she does not drink alcohol and does not use drugs.  Past Medical History:  Diagnosis Date   Allergy    GERD (gastroesophageal reflux disease)     No past surgical history on file.  Current Outpatient Medications  Medication Sig Dispense Refill   cetirizine (ZYRTEC) 10 MG tablet Take 10 mg by mouth daily.     LORazepam (ATIVAN) 0.5 MG tablet Take 1 tablet (0.5 mg total) by mouth every 8 (eight) hours. 20 tablet 0   Misc Natural Products (IMMUNE FORMULA PO) Take by mouth. W/ Elderberry  and Vitamin C     Multiple Vitamin (MULTIVITAMIN) tablet Take 1 tablet by mouth daily.     Multiple Vitamins-Minerals (ENERGY BOOSTER PO) Take by mouth. W/B12 and CoQ10     No current facility-administered medications for this visit.    Family History  Problem Relation Age of Onset   Stroke Mother    Arthritis Mother    Drug abuse Father    Emphysema Father    Asthma Sister    Mental illness Sister    Drug abuse Brother    Depression Brother    Hypertension Maternal Grandmother    Heart disease Maternal Grandmother    Early death Maternal Grandmother    Cancer Maternal Grandfather        Throat   Hearing loss Paternal Grandmother    Early death Paternal Grandfather    Emphysema Paternal  Grandfather    Breast cancer Neg Hx     Review of Systems  Exam:   There were no vitals taken for this visit.    General appearance: alert, cooperative and appears stated age Head: normocephalic, without obvious abnormality, atraumatic Neck: no adenopathy, supple, symmetrical, trachea midline and thyroid normal to inspection and palpation Lungs: clear to auscultation bilaterally Breasts: normal appearance, no masses or tenderness, No nipple retraction or dimpling, No nipple discharge or bleeding, No axillary adenopathy Heart: regular rate and rhythm Abdomen: soft, non-tender; no masses, no organomegaly Extremities: extremities normal, atraumatic, no cyanosis or edema Skin: skin color, texture, turgor normal. No rashes or lesions Lymph nodes: cervical, supraclavicular, and axillary nodes normal. Neurologic: grossly normal  Pelvic: External genitalia:  no lesions              No abnormal inguinal nodes palpated.              Urethra:  normal appearing urethra with no masses, tenderness or lesions              Bartholins and Skenes: normal                 Vagina: normal appearing vagina with  normal color and discharge, no lesions              Cervix: no lesions              Pap taken: {yes no:314532} Bimanual Exam:  Uterus:  normal size, contour, position, consistency, mobility, non-tender              Adnexa: no mass, fullness, tenderness              Rectal exam: {yes no:314532}.  Confirms.              Anus:  normal sphincter tone, no lesions  Chaperone was present for exam:  ***  Assessment:   Well woman visit with gynecologic exam.   Plan: Mammogram screening discussed. Self breast awareness reviewed. Pap and HR HPV as above. Guidelines for Calcium, Vitamin D, regular exercise program including cardiovascular and weight bearing exercise.   Follow up annually and prn.   Additional counseling given.  {yes [id number]. _______ minutes face to face time of which over 50%  was spent in counseling.    After visit summary provided.

## 2022-11-25 ENCOUNTER — Ambulatory Visit: Payer: BC Managed Care – PPO | Admitting: Obstetrics and Gynecology

## 2022-12-31 ENCOUNTER — Ambulatory Visit (INDEPENDENT_AMBULATORY_CARE_PROVIDER_SITE_OTHER): Payer: Self-pay | Admitting: Radiology

## 2022-12-31 ENCOUNTER — Encounter: Payer: Self-pay | Admitting: Radiology

## 2022-12-31 VITALS — BP 114/78 | Ht 64.5 in | Wt 139.0 lb

## 2022-12-31 DIAGNOSIS — Z01419 Encounter for gynecological examination (general) (routine) without abnormal findings: Secondary | ICD-10-CM

## 2022-12-31 DIAGNOSIS — N951 Menopausal and female climacteric states: Secondary | ICD-10-CM

## 2022-12-31 MED ORDER — ESTRADIOL 0.025 MG/24HR TD PTTW
1.0000 | MEDICATED_PATCH | TRANSDERMAL | 0 refills | Status: DC
Start: 2022-12-31 — End: 2022-12-31

## 2022-12-31 MED ORDER — ESTRADIOL 0.025 MG/24HR TD PTTW
1.0000 | MEDICATED_PATCH | TRANSDERMAL | 0 refills | Status: DC
Start: 2022-12-31 — End: 2023-03-22

## 2022-12-31 MED ORDER — PROGESTERONE MICRONIZED 100 MG PO CAPS: 100.0000 mg | ORAL_CAPSULE | Freq: Every day | ORAL | 0 refills | Status: DC

## 2022-12-31 NOTE — Progress Notes (Signed)
   Carolyn Ferrell [birth year] [id number]   History: Postmenopausal 52 y.o. presents for annual exam. Interested in HRT to manage menopausal vasomotor symptoms, anxiety, joint pain. Had consult with Dr Edward Jolly 10/23 and discussed all options.   Gynecologic History Postmenopausal Last Pap: 2021. Results were: normal Last mammogram: 4/23. Results were: normal Last colonoscopy: 2023 cologuard negative   Obstetric History OB History  Gravida Para Term Preterm AB Living  2 2 2     2   SAB IAB Ectopic Multiple Live Births               # Outcome Date GA Lbr Len/2nd Weight Sex Type Anes PTL Lv  2 Term           1 Term             Obstetric Comments  Has 2 biological children and 1 adopted child     The following portions of the patient's history were reviewed and updated as appropriate: allergies, current medications, past family history, past medical history, past social history, past surgical history, and problem list.  Review of Systems Pertinent items noted in HPI and remainder of comprehensive ROS otherwise negative.  Past medical history, past surgical history, family history and social history were all reviewed and documented in the EPIC chart.  Exam:  Vitals:   12/31/22 1432  BP: 114/78  Weight: 139 lb (63 kg)  Height: 5' 4.5" (1.638 m)   Body mass index is 23.49 kg/m.  General appearance:  Normal Thyroid:  Symmetrical, normal in size, without palpable masses or nodularity. Respiratory  Auscultation:  Clear without wheezing or rhonchi Cardiovascular  Auscultation:  Regular rate, without rubs, murmurs or gallops  Edema/varicosities:  Not grossly evident Abdominal  Soft,nontender, without masses, guarding or rebound.  Liver/spleen:  No organomegaly noted  Hernia:  None appreciated  Skin  Inspection:  Grossly normal Breasts: Examined lying and sitting.   Right: Without masses, retractions, nipple discharge or axillary adenopathy.   Left: Without masses,  retractions, nipple discharge or axillary adenopathy. Genitourinary   Inguinal/mons:  Normal without inguinal adenopathy  External genitalia:  Normal appearing vulva with no masses, tenderness, or lesions  BUS/Urethra/Skene's glands:  Normal  Vagina:  Normal appearing with normal color and discharge, no lesions. Atrophy: mild   Cervix:  Normal appearing without discharge or lesions  Uterus:  Normal in size, shape and contour.  Midline and mobile, nontender  Adnexa/parametria:     Rt: Normal in size, without masses or tenderness.   Lt: Normal in size, without masses or tenderness.  Anus and perineum: Normal    Carolyn Ferrell, CMA present for exam  Assessment/Plan:   1. Well woman exam with routine gynecological exam Pap 2026 Mammogram overdue, needs to schedule  2. Menopausal symptoms Risks and benefits reviewed - estradiol (VIVELLE-DOT) 0.025 MG/24HR; Place 1 patch onto the skin 2 (two) times a week.  Dispense: 24 patch; Refill: 0 - progesterone (PROMETRIUM) 100 MG capsule; Take 1 capsule (100 mg total) by mouth daily.  Dispense: 90 capsule; Refill: 0   Follow up 3 months  Evarose Altland B WHNP-BC, 3:08 PM 12/31/2022

## 2023-02-21 IMAGING — MG MM DIGITAL SCREENING BILAT W/ TOMO AND CAD
8 series · 9 of 24 positions shown · non-contrast
Comparison: Previous exam(s).

CLINICAL DATA: Screening.

EXAM:
DIGITAL SCREENING BILATERAL MAMMOGRAM WITH TOMOSYNTHESIS AND CAD
TECHNIQUE: Bilateral screening digital craniocaudal and mediolateral oblique
mammograms were obtained. Bilateral screening digital breast
tomosynthesis was performed. The images were evaluated with
computer-aided detection.

[L MLO synth-2D]
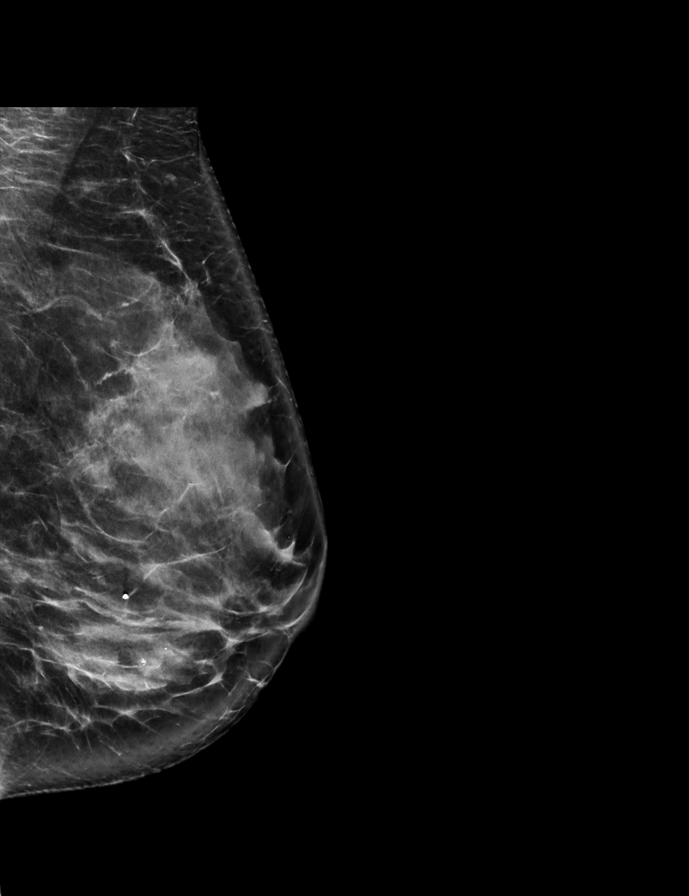

[L CC synth-2D]
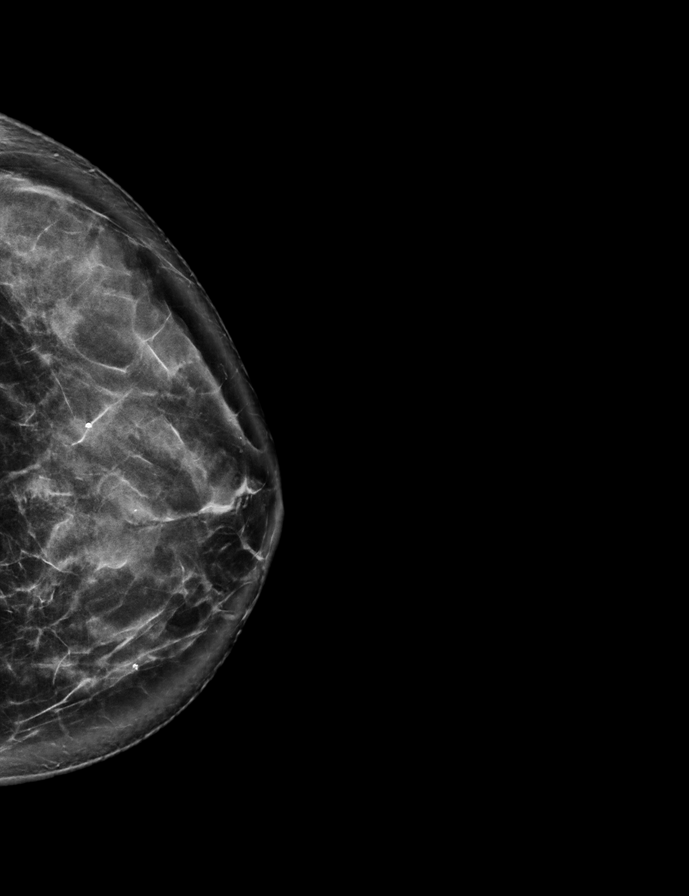

[R CC synth-2D]
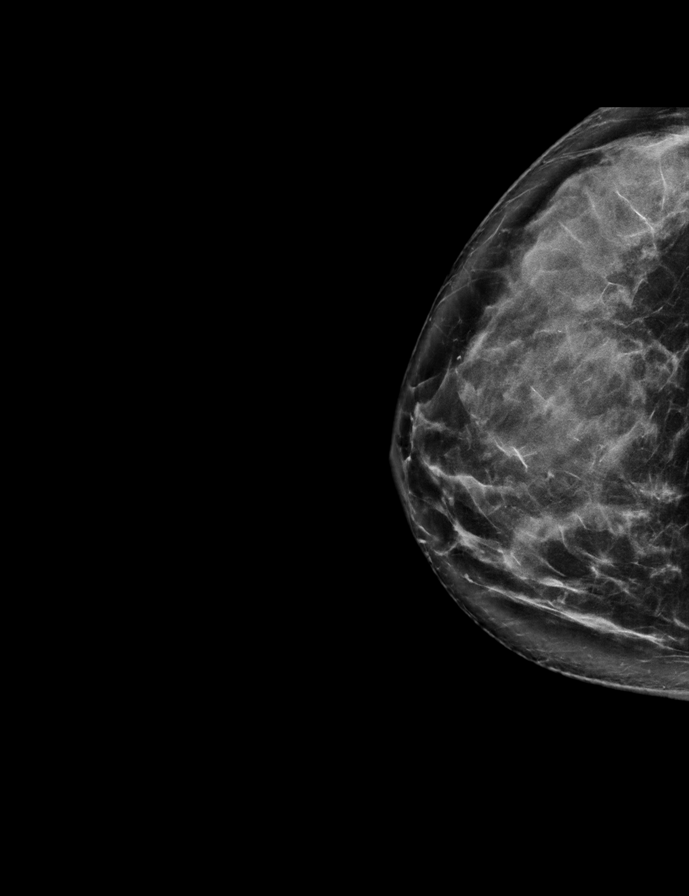

[R MLO synth-2D]
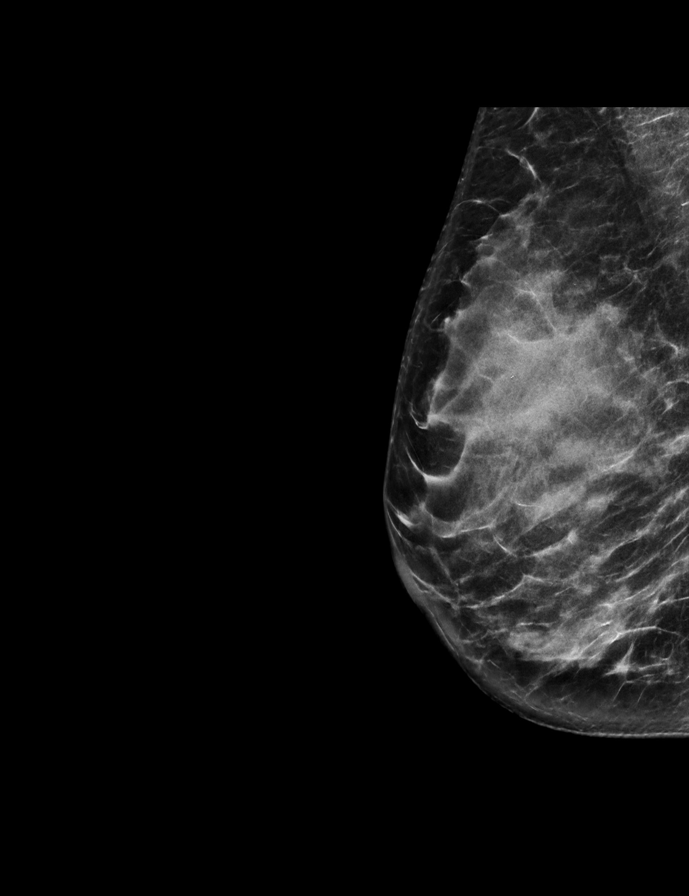

[R CC tomo · 2 of 73 frames shown]
[frame 24/73]
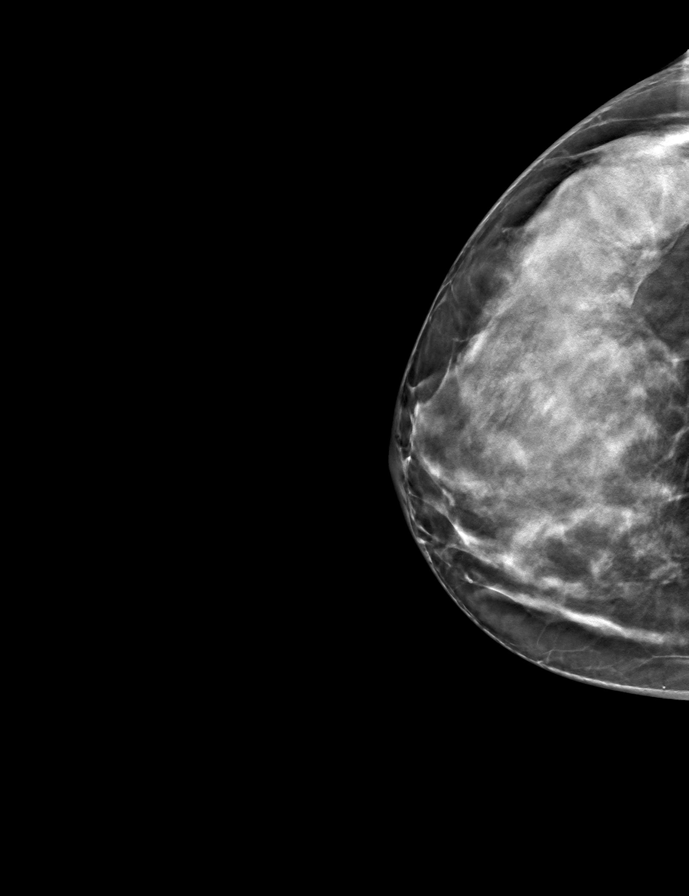
[frame 37/73]
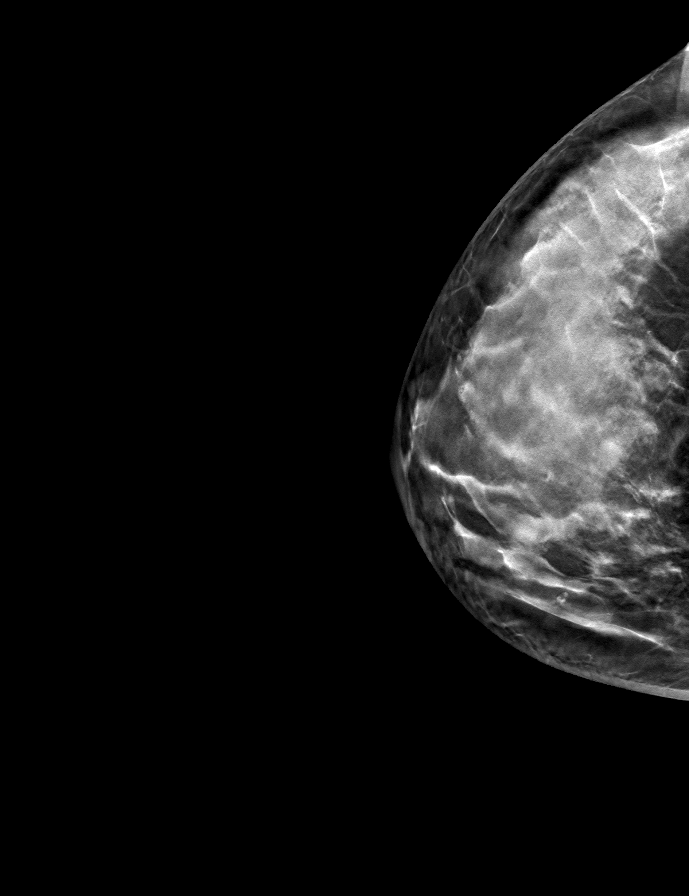

[L CC tomo · tomo slice 38/75.0]
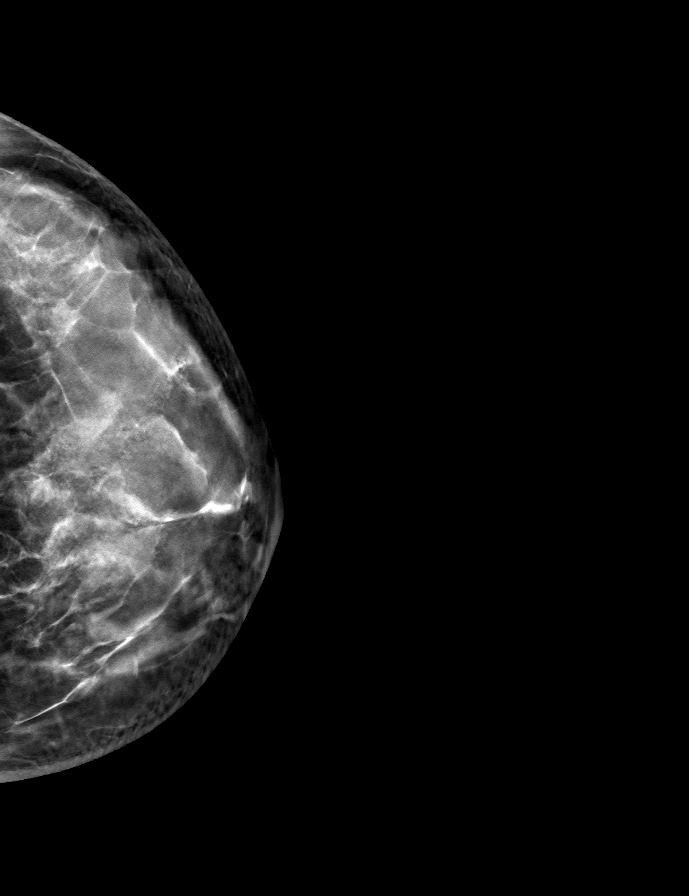

[L MLO tomo · tomo slice 39/76.0]
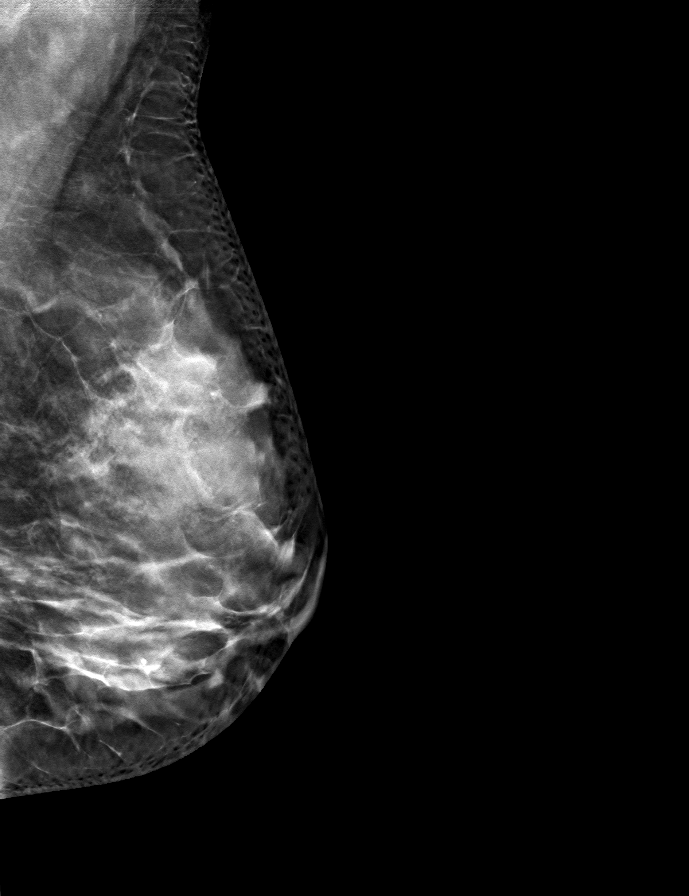

[R MLO tomo · tomo slice 33/65.0]
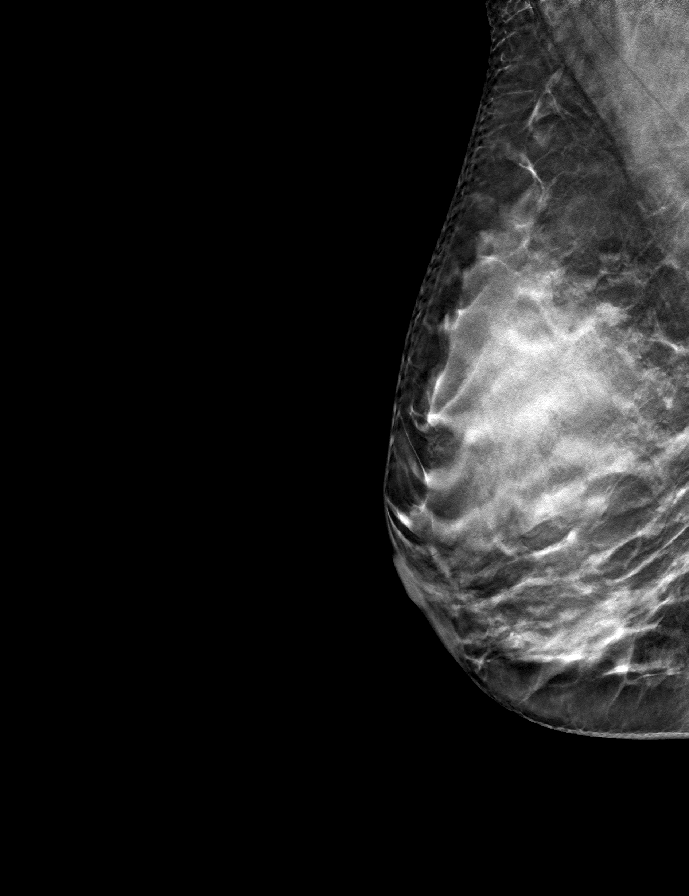

[9 of 24 positions shown; findings below may reference images not displayed]

ACR Breast Density Category d: The breast tissue is extremely dense,
which lowers the sensitivity of mammography
FINDINGS: There are no findings suspicious for malignancy.
IMPRESSION: No mammographic evidence of malignancy. A result letter of this
screening mammogram will be mailed directly to the patient.

RECOMMENDATION:
Screening mammogram in one year. (Code:TA-V-WV9)

BI-RADS CATEGORY  1: Negative.

## 2023-03-15 NOTE — Progress Notes (Signed)
GYNECOLOGY  VISIT   HPI: 52 y.o.   Married  Caucasian female   G2P2002 with Patient's last menstrual period was 03/10/2023.   here for: 3 mo f/u  -- HRT.  Using transdermal estradiol and progesterone since her visit on 12/31/22 with NP.  Doing well.  Can wake up warm.  Sex is more comfortable.  Elbow feels better.  Sleeping better.   Had a period starting 03/02/23 after going on vacation with girlfriends. Lasted 7 days, and needed to wear a pad.  Also had spotting in April, 2023.  Had evidence of bleeding on 11/01/22 at her office visit.  FSH 61.6 and estradiol 36 on 10/31/21.  Has had spotting for the last couple of years.   GYNECOLOGIC HISTORY: Patient's last menstrual period was 03/10/2023. Contraception:  perimenopause/vasectomy Menopausal hormone therapy:  progesterone Pap:   No results found for: "DIAGPAP", "HPVHIGH", "ADEQPAP" History of abnormal Pap or positive HPV:  no Mammogram:  08/13/21 Breast Density cat D, BI-RADS CAT 1 neg        OB History     Gravida  2   Para  2   Term  2   Preterm      AB      Living  2      SAB      IAB      Ectopic      Multiple      Live Births           Obstetric Comments  Has 2 biological children and 1 adopted child            Patient Active Problem List   Diagnosis Date Noted   Perimenopausal symptom 06/20/2021   Tendonitis of elbow, left 06/20/2021   Situational anxiety 06/20/2021    Past Medical History:  Diagnosis Date   Allergy    GERD (gastroesophageal reflux disease)     History reviewed. No pertinent surgical history.  Current Outpatient Medications  Medication Sig Dispense Refill   Fexofenadine HCl (ALLEGRA PO) Take by mouth.     LORazepam (ATIVAN) 0.5 MG tablet Take 1 tablet (0.5 mg total) by mouth every 8 (eight) hours. 20 tablet 0   Misc Natural Products (IMMUNE FORMULA PO) Take by mouth. W/ Elderberry  and Vitamin C     Multiple Vitamin (MULTIVITAMIN) tablet Take 1 tablet by  mouth daily.     Nutritional Supplements (ESTROVEN PO) Take by mouth. Soy formula     omeprazole (PRILOSEC OTC) 20 MG tablet Take 1 tablet by mouth daily.     estradiol (VIVELLE-DOT) 0.025 MG/24HR Place 1 patch onto the skin 2 (two) times a week. 24 patch 2   Multiple Vitamins-Minerals (ENERGY BOOSTER PO) Take by mouth. W/B12 and CoQ10 (Patient not taking: Reported on 12/31/2022)     progesterone (PROMETRIUM) 100 MG capsule Take 1 capsule (100 mg total) by mouth daily. 90 capsule 2   No current facility-administered medications for this visit.     ALLERGIES: Patient has no known allergies.  Family History  Problem Relation Age of Onset   Stroke Mother    Arthritis Mother    Drug abuse Father    Emphysema Father    Asthma Sister    Mental illness Sister    Drug abuse Brother    Depression Brother    Hypertension Maternal Grandmother    Heart disease Maternal Grandmother    Early death Maternal Grandmother    Cancer Maternal Grandfather  Throat   Hearing loss Paternal Grandmother    Early death Paternal Grandfather    Emphysema Paternal Grandfather    Breast cancer Neg Hx     Social History   Socioeconomic History   Marital status: Married    Spouse name: Not on file   Number of children: Not on file   Years of education: Not on file   Highest education level: Not on file  Occupational History   Not on file  Tobacco Use   Smoking status: Never    Passive exposure: Past   Smokeless tobacco: Never  Vaping Use   Vaping status: Never Used  Substance and Sexual Activity   Alcohol use: Never   Drug use: Never   Sexual activity: Yes    Partners: Male    Comment: Vasectomy, menarche 52yo, sexual debut 52yo  Other Topics Concern   Not on file  Social History Narrative   Not on file   Social Determinants of Health   Financial Resource Strain: Not on file  Food Insecurity: Not on file  Transportation Needs: Not on file  Physical Activity: Not on file  Stress:  Not on file  Social Connections: Unknown (06/20/2022)   Received from [facility], [facility]   Social Network    Social Network: Not on file  Intimate Partner Violence: Unknown (06/20/2022)   Received from [facility], [facility]   HITS    Physically Hurt: Not on file    Insult or Talk Down To: Not on file    Threaten Physical Harm: Not on file    Scream or Curse: Not on file    Review of Systems  All other systems reviewed and are negative.   PHYSICAL EXAMINATION:   BP 128/74 (BP Location: Left Arm, Patient Position: Sitting, Cuff Size: Normal)   Pulse 63   Ht 5' 4.5" (1.638 m)   Wt 140 lb (63.5 kg)   LMP 03/10/2023   SpO2 99%   BMI 23.66 kg/m     General appearance: alert, cooperative and appears stated age  ASSESSMENT:  Perimenopausal female.  HRT.  Doing well.   PLAN:  Check FSH and estradiol (patient is on HRT.) If FSH supports menopausal range, will need pelvic US and possible endometrial biopsy.  Refill of Vivelle Dot 0.025 mg twice weekly and Prometrium 100 mg q hs.  Discused WHI and use of HRT which can increase risk of PE, DVT, MI, stroke and breast cancer.  She will update her mammogram.  FU for yearly exam and prn.   29 min  total time was spent for this patient encounter, including preparation, face-to-face counseling with the patient, coordination of care, and documentation of the encounter.  An After Visit Summary was provided to the patient.

## 2023-03-21 ENCOUNTER — Other Ambulatory Visit: Payer: Self-pay | Admitting: Radiology

## 2023-03-21 DIAGNOSIS — N951 Menopausal and female climacteric states: Secondary | ICD-10-CM

## 2023-03-22 NOTE — Telephone Encounter (Signed)
Med refill request: estradiol 0.025 mg patch Last AEX: 12/31/22 (follow up needed in 3 months) Next OV:  03/29/23 Last MMG (if hormonal med) 08/13/21 BI-RADS 1 Refill estradiol 0.025 mg patch #24 Sent to provider for review.

## 2023-03-23 ENCOUNTER — Ambulatory Visit: Payer: Self-pay | Admitting: Obstetrics and Gynecology

## 2023-03-27 ENCOUNTER — Other Ambulatory Visit: Payer: Self-pay | Admitting: Radiology

## 2023-03-27 DIAGNOSIS — N951 Menopausal and female climacteric states: Secondary | ICD-10-CM

## 2023-03-29 ENCOUNTER — Encounter: Payer: Self-pay | Admitting: Obstetrics and Gynecology

## 2023-03-29 ENCOUNTER — Ambulatory Visit (INDEPENDENT_AMBULATORY_CARE_PROVIDER_SITE_OTHER): Payer: Self-pay | Admitting: Obstetrics and Gynecology

## 2023-03-29 VITALS — BP 128/74 | HR 63 | Ht 64.5 in | Wt 140.0 lb

## 2023-03-29 DIAGNOSIS — Z79890 Hormone replacement therapy (postmenopausal): Secondary | ICD-10-CM

## 2023-03-29 DIAGNOSIS — N951 Menopausal and female climacteric states: Secondary | ICD-10-CM

## 2023-03-29 MED ORDER — PROGESTERONE MICRONIZED 100 MG PO CAPS: 100.0000 mg | ORAL_CAPSULE | Freq: Every day | ORAL | 2 refills | Status: DC

## 2023-03-29 MED ORDER — ESTRADIOL 0.025 MG/24HR TD PTTW
1.0000 | MEDICATED_PATCH | TRANSDERMAL | 2 refills | Status: DC
Start: 2023-03-29 — End: 2024-01-18

## 2023-03-30 LAB — FOLLICLE STIMULATING HORMONE: FSH: 73.4 m[IU]/mL

## 2023-03-30 LAB — ESTRADIOL: Estradiol: 45 pg/mL

## 2023-04-02 ENCOUNTER — Other Ambulatory Visit: Payer: Self-pay

## 2023-04-02 ENCOUNTER — Other Ambulatory Visit: Payer: Self-pay | Admitting: Obstetrics and Gynecology

## 2023-04-02 DIAGNOSIS — N950 Postmenopausal bleeding: Secondary | ICD-10-CM

## 2023-04-02 DIAGNOSIS — Z79890 Hormone replacement therapy (postmenopausal): Secondary | ICD-10-CM

## 2023-04-13 ENCOUNTER — Inpatient Hospital Stay (HOSPITAL_BASED_OUTPATIENT_CLINIC_OR_DEPARTMENT_OTHER): Admission: RE | Admit: 2023-04-13 | Payer: Self-pay | Source: Ambulatory Visit | Admitting: Radiology

## 2023-04-13 DIAGNOSIS — Z1231 Encounter for screening mammogram for malignant neoplasm of breast: Secondary | ICD-10-CM

## 2023-04-16 ENCOUNTER — Ambulatory Visit (HOSPITAL_BASED_OUTPATIENT_CLINIC_OR_DEPARTMENT_OTHER)
Admission: RE | Admit: 2023-04-16 | Discharge: 2023-04-16 | Disposition: A | Discharge status: Home / Self Care | Payer: Self-pay | Source: Ambulatory Visit | Attending: Obstetrics and Gynecology | Admitting: Obstetrics and Gynecology

## 2023-04-16 ENCOUNTER — Other Ambulatory Visit (HOSPITAL_BASED_OUTPATIENT_CLINIC_OR_DEPARTMENT_OTHER): Payer: Self-pay | Admitting: Obstetrics and Gynecology

## 2023-04-16 ENCOUNTER — Encounter (HOSPITAL_BASED_OUTPATIENT_CLINIC_OR_DEPARTMENT_OTHER): Payer: Self-pay | Admitting: Radiology

## 2023-04-16 DIAGNOSIS — N950 Postmenopausal bleeding: Secondary | ICD-10-CM | POA: Insufficient documentation

## 2023-04-16 DIAGNOSIS — Z1231 Encounter for screening mammogram for malignant neoplasm of breast: Secondary | ICD-10-CM | POA: Insufficient documentation

## 2023-04-21 ENCOUNTER — Telehealth: Payer: Self-pay

## 2023-04-21 ENCOUNTER — Other Ambulatory Visit: Payer: Self-pay | Admitting: Obstetrics and Gynecology

## 2023-04-21 DIAGNOSIS — R928 Other abnormal and inconclusive findings on diagnostic imaging of breast: Secondary | ICD-10-CM

## 2023-04-21 NOTE — Telephone Encounter (Signed)
Telephoned patient at mobile number. After completing BCCCP screening process, patient ineligible for BCCCP program based on federal poverty guidelines.

## 2023-04-23 ENCOUNTER — Encounter: Payer: Self-pay | Admitting: Obstetrics and Gynecology

## 2023-04-23 NOTE — Telephone Encounter (Signed)
Routing FYI.   Encounter closed.

## 2023-04-26 ENCOUNTER — Encounter: Payer: Self-pay | Admitting: Obstetrics and Gynecology

## 2023-04-26 ENCOUNTER — Ambulatory Visit (INDEPENDENT_AMBULATORY_CARE_PROVIDER_SITE_OTHER): Payer: Self-pay | Admitting: Obstetrics and Gynecology

## 2023-04-26 ENCOUNTER — Other Ambulatory Visit (HOSPITAL_COMMUNITY)
Admission: RE | Admit: 2023-04-26 | Discharge: 2023-04-26 | Disposition: A | Discharge status: Home / Self Care | Payer: Self-pay | Source: Ambulatory Visit | Attending: Obstetrics and Gynecology | Admitting: Obstetrics and Gynecology

## 2023-04-26 VITALS — BP 128/76 | HR 65 | Ht 64.5 in | Wt 140.0 lb

## 2023-04-26 DIAGNOSIS — R9389 Abnormal findings on diagnostic imaging of other specified body structures: Secondary | ICD-10-CM

## 2023-04-26 DIAGNOSIS — N950 Postmenopausal bleeding: Secondary | ICD-10-CM

## 2023-04-26 DIAGNOSIS — Z79890 Hormone replacement therapy (postmenopausal): Secondary | ICD-10-CM

## 2023-04-26 DIAGNOSIS — Z01812 Encounter for preprocedural laboratory examination: Secondary | ICD-10-CM

## 2023-04-26 LAB — PREGNANCY, URINE: Preg Test, Ur: NEGATIVE

## 2023-04-26 NOTE — Progress Notes (Signed)
GYNECOLOGY  VISIT   HPI: 52 y.o.   Married  Caucasian female   G2P2002 with Patient's last menstrual period was 04/20/2023.   here for: Pelvic ultrasound and endometrial biopsy for postmenopausal bleeding on HRT.   Bleeding started again 6 days ago.    Prior bleeding was after vacation October 22.   Has been on HRT for about 4 months ago. She likes taking the hormones.   Study Result  Narrative & Impression  CLINICAL DATA:  Postmenopausal bleeding   EXAM: TRANSABDOMINAL AND TRANSVAGINAL ULTRASOUND OF PELVIS   TECHNIQUE: Both transabdominal and transvaginal ultrasound examinations of the pelvis were performed. Transabdominal technique was performed for global imaging of the pelvis including uterus, ovaries, adnexal regions, and pelvic cul-de-sac. It was necessary to proceed with endovaginal exam following the transabdominal exam to visualize the endometrium and left ovary.   COMPARISON:  None Available.   FINDINGS: Uterus   Measurements: 9.5 x 3.5 x 5.4 cm = volume: 94 mL. 13 x 9 x 11 mm subserosal anterior fundal fibroid.   Endometrium   Thickness: 8 mm.  No focal abnormality visualized.   Right ovary   Not discretely visualized.  No adnexal mass is seen.   Left ovary   Measurements: 2.3 x 1.1 x 0.8 cm = volume: 1.1 mL. Normal appearance/no adnexal mass.   Other findings   No abnormal free fluid.   IMPRESSION: Endometrial complex measures 8 mm, within normal limits in a perimenopausal patient, but abnormal if this patient is postmenopausal.   In the setting of post-menopausal bleeding, endometrial sampling is indicated to exclude carcinoma. If results are benign, sonohysterogram should be considered for focal lesion work-up.   13 mm subserosal anterior fundal fibroid.   Nonvisualized right ovary.  No adnexal mass is seen.     Electronically Signed   By: Charline Bills M.D.   On: 04/21/2023 23:26      UPT negative.   GYNECOLOGIC  HISTORY: Patient's last menstrual period was 04/20/2023. Contraception:  vasectomy/perimenopause Menopausal hormone therapy:  progesterone and vivelle-dot Last 2 paps:  2021 per pt History of abnormal Pap or positive HPV:  no Mammogram:  04/16/23 Breast density cat d, BI-RADS CAT 0 incomplete        OB History     Gravida  2   Para  2   Term  2   Preterm      AB      Living  2      SAB      IAB      Ectopic      Multiple      Live Births           Obstetric Comments  Has 2 biological children and 1 adopted child            Patient Active Problem List   Diagnosis Date Noted   Perimenopausal symptom 06/20/2021   Tendonitis of elbow, left 06/20/2021   Situational anxiety 06/20/2021    Past Medical History:  Diagnosis Date   Allergy    GERD (gastroesophageal reflux disease)     History reviewed. No pertinent surgical history.  Current Outpatient Medications  Medication Sig Dispense Refill   estradiol (VIVELLE-DOT) 0.025 MG/24HR Place 1 patch onto the skin 2 (two) times a week. 24 patch 2   Fexofenadine HCl (ALLEGRA PO) Take by mouth.     LORazepam (ATIVAN) 0.5 MG tablet Take 1 tablet (0.5 mg total) by mouth every 8 (eight) hours.  20 tablet 0   Misc Natural Products (IMMUNE FORMULA PO) Take by mouth. W/ Elderberry  and Vitamin C     Multiple Vitamin (MULTIVITAMIN) tablet Take 1 tablet by mouth daily.     Nutritional Supplements (ESTROVEN PO) Take by mouth. Soy formula     omeprazole (PRILOSEC OTC) 20 MG tablet Take 1 tablet by mouth daily.     progesterone (PROMETRIUM) 100 MG capsule Take 1 capsule (100 mg total) by mouth daily. 90 capsule 2   Multiple Vitamins-Minerals (ENERGY BOOSTER PO) Take by mouth. W/B12 and CoQ10 (Patient not taking: Reported on 04/26/2023)     No current facility-administered medications for this visit.     ALLERGIES: Patient has no known allergies.  Family History  Problem Relation Age of Onset   Stroke Mother     Arthritis Mother    Drug abuse Father    Emphysema Father    Asthma Sister    Mental illness Sister    Drug abuse Brother    Depression Brother    Hypertension Maternal Grandmother    Heart disease Maternal Grandmother    Early death Maternal Grandmother    Cancer Maternal Grandfather        Throat   Hearing loss Paternal Grandmother    Early death Paternal Grandfather    Emphysema Paternal Grandfather    Breast cancer Neg Hx     Social History   Socioeconomic History   Marital status: Married    Spouse name: Not on file   Number of children: Not on file   Years of education: Not on file   Highest education level: Not on file  Occupational History   Not on file  Tobacco Use   Smoking status: Never    Passive exposure: Past   Smokeless tobacco: Never  Vaping Use   Vaping status: Never Used  Substance and Sexual Activity   Alcohol use: Never   Drug use: Never   Sexual activity: Yes    Partners: Male    Comment: Vasectomy, menarche 52yo, sexual debut 52yo  Other Topics Concern   Not on file  Social History Narrative   Not on file   Social Drivers of Health   Financial Resource Strain: Not on file  Food Insecurity: Not on file  Transportation Needs: Not on file  Physical Activity: Not on file  Stress: Not on file  Social Connections: Unknown (06/20/2022)   Received from [facility], [facility]   Social Network    Social Network: Not on file  Intimate Partner Violence: Unknown (06/20/2022)   [facility], [facility]   HITS    Physically Hurt: Not on file    Insult or Talk Down To: Not on file    Threaten Physical Harm: Not on file    Scream or Curse: Not on file    Review of Systems  All other systems reviewed and are negative.   PHYSICAL EXAMINATION:   BP 128/76 (BP Location: Right Arm, Patient Position: Sitting, Cuff Size: Small)   Pulse 65   Ht 5' 4.5" (1.638 m)   Wt 140 lb (63.5 kg)   LMP 04/20/2023   SpO2 99%   BMI 23.66  kg/m     General appearance: alert, cooperative and appears stated age   Pelvic: External genitalia:  no lesions              Urethra:  normal appearing urethra with no masses, tenderness or lesions  Bartholins and Skenes: normal                 Vagina: normal appearing vagina with normal color and discharge, no lesions              Cervix: no lesions                Bimanual Exam:  Uterus:  normal size, contour, position, consistency, mobility, non-tender              Adnexa: no mass, fullness, tenderness              Endometrial biopsy Consent done.  Sterile prep with Hibiclens.  Paracervical block with 10 cc 1% lidocaine, lot [id number], exp 01/2025. Pipelle passed to 7.5 cm x 2.  Tissue to pathology.  No complications.  Minimal EBL.  Chaperone was present for exam:  Warren Lacy, CMA  ASSESSMENT:  Postmenopausal bleeding.  Hormone replacement therapy.  Thickened endometrium.  Small subserosal fibroid.   PLAN:  Pelvic US findings reviewed.  FU EMB.  OK to continue current HRT.

## 2023-04-26 NOTE — Patient Instructions (Signed)
 Endometrial Biopsy  An endometrial biopsy is a procedure where a tissue sample is removed from the lining of the uterus. This lining is called the endometrium. The tissue sample is then sent to a lab for testing. You may have this type of biopsy to check for: Cancer. Infection. Growths called polyps. Uterine bleeding that can't be explained. Tell a health care provider about: Any allergies you have. All medicines you're taking including vitamins, herbs, eye drops, creams, and over-the-counter medicines. Any problems you or family members have had with anesthesia. Any bleeding problems you have. Any surgeries you have had. Any medical problems you have. Whether you're pregnant or may be pregnant. What are the risks? Your health care provider will talk with you about risks. These may include: Bleeding. Infection. Allergic reactions to medicines. Damage to the wall of the uterus. This is rare. What happens before the procedure? Keep track of your period. You may need to have this biopsy when you're not having your period. Ask your provider about: Changing or stopping your regular medicines. These include any diabetes medicines or blood thinners you take. Taking medicines such as aspirin and ibuprofen. These medicines can thin your blood. Do not take them unless your provider tells you to. Taking over-the-counter medicines, vitamins, herbs, and supplements. Bring a pad with you. You may need to wear one after the biopsy. Plan to have a responsible adult take you home from the hospital or clinic. You won't be allowed to drive. What happens during the procedure? A tool will be put into your vagina to hold it open. This helps your provider see the cervix. The cervix is the lowest part of the uterus. Your cervix will be cleaned with a solution that kills germs. You will be given anesthesia. This keeps you from feeling pain. It will numb your cervix. A tool called forceps will be used to  hold your cervix steady. A thin tool called a uterine sound will be put through your cervix. It will be used to: Find the length of your uterus. Find where to take the sample from. A soft tube called a catheter will be put into your uterus. The catheter will remove a tissue sample. The tube and tools will be removed. The sample will be sent to a lab for testing. The procedure may vary among providers and hospitals. What happens after the procedure? Your blood pressure, heart rate, breathing rate, and blood oxygen level will be monitored until you leave the hospital or clinic. It's up to you to get the results of your procedure. Ask your provider, or the department that is doing the procedure, when your results will be ready. This information is not intended to replace advice given to you by your health care provider. Make sure you discuss any questions you have with your health care provider. Document Revised: 07/07/2022 Document Reviewed: 07/07/2022 Elsevier Patient Education  2024 [employer].

## 2023-04-28 LAB — SURGICAL PATHOLOGY

## 2023-05-01 ENCOUNTER — Other Ambulatory Visit: Payer: Self-pay | Admitting: Obstetrics and Gynecology

## 2023-05-01 DIAGNOSIS — N950 Postmenopausal bleeding: Secondary | ICD-10-CM

## 2023-05-01 DIAGNOSIS — R9389 Abnormal findings on diagnostic imaging of other specified body structures: Secondary | ICD-10-CM

## 2023-05-11 ENCOUNTER — Ambulatory Visit
Admission: RE | Admit: 2023-05-11 | Discharge: 2023-05-11 | Disposition: A | Discharge status: Home / Self Care | Payer: Self-pay | Source: Ambulatory Visit | Attending: Obstetrics and Gynecology | Admitting: Obstetrics and Gynecology

## 2023-05-11 ENCOUNTER — Ambulatory Visit
Admission: RE | Admit: 2023-05-11 | Discharge: 2023-05-11 | Disposition: A | Discharge status: Home / Self Care | Payer: No Typology Code available for payment source | Source: Ambulatory Visit | Attending: Obstetrics and Gynecology | Admitting: Obstetrics and Gynecology

## 2023-05-11 DIAGNOSIS — R928 Other abnormal and inconclusive findings on diagnostic imaging of breast: Secondary | ICD-10-CM

## 2023-05-19 ENCOUNTER — Other Ambulatory Visit: Payer: Self-pay | Admitting: Family

## 2023-05-19 DIAGNOSIS — F418 Other specified anxiety disorders: Secondary | ICD-10-CM

## 2023-05-24 ENCOUNTER — Encounter: Payer: Self-pay | Admitting: Obstetrics and Gynecology

## 2023-05-24 DIAGNOSIS — F418 Other specified anxiety disorders: Secondary | ICD-10-CM

## 2023-05-25 NOTE — Telephone Encounter (Signed)
 Med refill request: Ativan 0.5 mg tab  Last AEX: 12/31/22 -JC Next AEX: 01/06/24 -BS  SHGM scheduled 06/08/23   Last MMG (if hormonal med) N/A Refill authorized: Please Advise?

## 2023-05-25 NOTE — Progress Notes (Unsigned)
GYNECOLOGY  VISIT   HPI: 53 y.o.   Married  Caucasian female   G2P2002 with No LMP recorded. Patient is perimenopausal.   here for: sonohysterogram.    Postmenopausal bleeding on HRT.  Bleeding resolved.   Prior US showed small subserosal fibroid and EMS 8 mm on 04/26/23.  EMB 05/06/23: benign inactive endometrium.   GYNECOLOGIC HISTORY: No LMP recorded. Patient is perimenopausal. Contraception:  vasectomy Menopausal hormone therapy:  vivelle-dot. Prometrium.  Last 2 paps:  2021 normal History of abnormal Pap or positive HPV:  no Mammogram:  05/11/23 Breast Density Cat D, BI-RADS CAT 1 neg        OB History     Gravida  2   Para  2   Term  2   Preterm      AB      Living  2      SAB      IAB      Ectopic      Multiple      Live Births           Obstetric Comments  Has 2 biological children and 1 adopted child            Patient Active Problem List   Diagnosis Date Noted   Perimenopausal symptom 06/20/2021   Tendonitis of elbow, left 06/20/2021   Situational anxiety 06/20/2021    Past Medical History:  Diagnosis Date   Allergy    GERD (gastroesophageal reflux disease)     History reviewed. No pertinent surgical history.  Current Outpatient Medications  Medication Sig Dispense Refill   estradiol (VIVELLE-DOT) 0.025 MG/24HR Place 1 patch onto the skin 2 (two) times a week. 24 patch 2   Fexofenadine HCl (ALLEGRA PO) Take by mouth.     LORazepam (ATIVAN) 0.5 MG tablet Take 1 tablet (0.5 mg total) by mouth every 8 (eight) hours. 20 tablet 0   Misc Natural Products (IMMUNE FORMULA PO) Take by mouth. W/ Elderberry  and Vitamin C     Multiple Vitamin (MULTIVITAMIN) tablet Take 1 tablet by mouth daily.     Multiple Vitamins-Minerals (ENERGY BOOSTER PO) Take by mouth. W/B12 and CoQ10     Nutritional Supplements (ESTROVEN PO) Take by mouth. Soy formula     omeprazole (PRILOSEC OTC) 20 MG tablet Take 1 tablet by mouth daily.     progesterone  (PROMETRIUM) 100 MG capsule Take 1 capsule (100 mg total) by mouth daily. 90 capsule 2   No current facility-administered medications for this visit.     ALLERGIES: Patient has no known allergies.  Family History  Problem Relation Age of Onset   Stroke Mother    Arthritis Mother    Drug abuse Father    Emphysema Father    Asthma Sister    Mental illness Sister    Drug abuse Brother    Depression Brother    Hypertension Maternal Grandmother    Heart disease Maternal Grandmother    Early death Maternal Grandmother    Cancer Maternal Grandfather        Throat   Hearing loss Paternal Grandmother    Early death Paternal Grandfather    Emphysema Paternal Grandfather    Breast cancer Neg Hx     Social History   Socioeconomic History   Marital status: Married    Spouse name: Not on file   Number of children: Not on file   Years of education: Not on file   Highest education level:  Not on file  Occupational History   Not on file  Tobacco Use   Smoking status: Never    Passive exposure: Past   Smokeless tobacco: Never  Vaping Use   Vaping status: Never Used  Substance and Sexual Activity   Alcohol use: Never   Drug use: Never   Sexual activity: Yes    Partners: Male    Comment: Vasectomy, menarche 53yo, sexual debut 53yo  Other Topics Concern   Not on file  Social History Narrative   Not on file   Social Drivers of Health   Financial Resource Strain: Not on file  Food Insecurity: Not on file  Transportation Needs: Not on file  Physical Activity: Not on file  Stress: Not on file  Social Connections: Unknown (06/20/2022)   Received from [facility], [facility]   Social Network    Social Network: Not on file  Intimate Partner Violence: Unknown (06/20/2022)   Received [facility], [facility]   HITS    Physically Hurt: Not on file    Insult or Talk Down To: Not on file    Threaten Physical Harm: Not on file    Scream or Curse: Not on file     Review of Systems  See HPI.  PHYSICAL EXAMINATION:   BP 122/72 (BP Location: Left Arm, Patient Position: Sitting, Cuff Size: Small)   Pulse 75   Ht 5' 4.5" (1.638 m)   Wt 140 lb (63.5 kg)   SpO2 99%   BMI 23.66 kg/m     General appearance: alert, cooperative and appears stated age   Pelvic ultrasound:   Uterus 7.86 x 4.59 x 3.69 cm. 2 intramural fibroids:  0.83 cm and 1.12 cm.  EMS 4.53 mm.  Left ovary 1.78 x 0.98 x 1.40 cm.   Normal.  Right ovary 2.18 x 1.07 x 0.99 cm.   Normal.  No adnexal masses.  No free fluid.   Sonohysgterogram: Consent done.  Sterile prep with Hibiclens.  Tenaculum to anterior cervical lip.  Normal saline injected through cannula.  No filling defect.  Potential scar tissue with thin web at uterine fundus.   ASSESSMENT:  Postmenopausal bleeding on HRT, resolved.  Small intramural uterine fibroids.  Thickened endometrium on prior US.  EMS improved with today's measurement.  Benign endometrial biopsy.   PLAN:  Medical care reviewed.     Ultrasound and sonohysterogram findings reviewed.  Fibroids discussed.  Ok to continue HRT.  No further evaluation needed.  Call for future postmenopausal bleeding.  Return for annual exam.

## 2023-06-05 ENCOUNTER — Encounter: Payer: Self-pay | Admitting: Family

## 2023-06-08 ENCOUNTER — Ambulatory Visit (INDEPENDENT_AMBULATORY_CARE_PROVIDER_SITE_OTHER): Payer: Self-pay | Admitting: Obstetrics and Gynecology

## 2023-06-08 ENCOUNTER — Other Ambulatory Visit: Payer: Self-pay | Admitting: Obstetrics and Gynecology

## 2023-06-08 ENCOUNTER — Encounter: Payer: Self-pay | Admitting: Obstetrics and Gynecology

## 2023-06-08 ENCOUNTER — Ambulatory Visit (INDEPENDENT_AMBULATORY_CARE_PROVIDER_SITE_OTHER): Payer: Self-pay

## 2023-06-08 VITALS — BP 122/72 | HR 75 | Ht 64.5 in | Wt 140.0 lb

## 2023-06-08 DIAGNOSIS — Z8742 Personal history of other diseases of the female genital tract: Secondary | ICD-10-CM

## 2023-06-08 DIAGNOSIS — R9389 Abnormal findings on diagnostic imaging of other specified body structures: Secondary | ICD-10-CM

## 2023-06-08 DIAGNOSIS — D251 Intramural leiomyoma of uterus: Secondary | ICD-10-CM

## 2023-06-08 DIAGNOSIS — N950 Postmenopausal bleeding: Secondary | ICD-10-CM

## 2023-06-08 NOTE — Telephone Encounter (Signed)
pt should have been told to schedule a visit, has been over a year! ok to do virtual

## 2023-06-08 NOTE — Patient Instructions (Addendum)
Carolyn Ferrell,   You had a mammogram for breast cancer screening, which was also recommended because of your taking hormone therapy.  You have been evaluated for bleeding following your visit on 03/29/23.   We determined you were postmenopausal by your [facility] level.   You then had a pelvic ultrasound which identified an abnormally thickened endometrium (uterine lining).  The biopsy did not explain adequately the reason for the endometrial thickening, so the sonohysterogram was recommended for further evaluation.

## 2023-06-10 ENCOUNTER — Telehealth: Payer: Self-pay | Admitting: Family

## 2023-06-10 DIAGNOSIS — F418 Other specified anxiety disorders: Secondary | ICD-10-CM

## 2023-06-10 MED ORDER — LORAZEPAM 1 MG PO TABS: 1.0000 mg | ORAL_TABLET | Freq: Three times a day (TID) | ORAL | 0 refills | Status: AC | PRN

## 2023-06-10 NOTE — Progress Notes (Signed)
MyChart Video Visit    Virtual Visit via Video Note   This format is felt to be most appropriate for this patient at this time. Physical exam was limited by quality of the video and audio technology used for the visit. CMA was able to get the patient set up on a video visit.  Patient location: Home. Patient and provider in visit Provider location: Office  I discussed the limitations of evaluation and management by telemedicine and the availability of in person appointments. The patient expressed understanding and agreed to proceed.  Visit Date: 06/10/2023  Today's healthcare provider: Dulce Sellar, NP     Subjective:   Patient ID: Carolyn Ferrell, female    DOB: [birth year], 53 y.o.   MRN: [id number]  Chief Complaint  Patient presents with   Anxiety    Pt will be traveling by plane on Monday. Pt would like medication refill of Lorazepam.     History of Present Illness   The patient presents with a request for a lorazepam refill for travel anxiety. She currently takes lorazepam at a dose of 0.5 mg as needed, sometimes requiring two tablets to achieve the desired effect. In addition to lorazepam, she is currently taking progesterone capsules and using Estrogen patches as part of her hormone replacement therapy regimen.      Assessment & Plan:     Flying Anxiety - Managed with Lorazepam 0.5mg  as needed, primarily for travel anxiety and sleep. Patient reports needing 2 tablets to work for her. -Refill Lorazepam with higher dose to 1mg  q8h prn, as needed for anxiety and/or sleep while traveling. -Send prescription to [facility] in [place]. -F/U prn.      Past Medical History:  Diagnosis Date   Allergy    GERD (gastroesophageal reflux disease)     No past surgical history on file.  Outpatient Medications Prior to Visit  Medication Sig Dispense Refill   estradiol (VIVELLE-DOT) 0.025 MG/24HR Place 1 patch onto the skin 2 (two) times a week. 24 patch 2    Fexofenadine HCl (ALLEGRA PO) Take by mouth.     LORazepam (ATIVAN) 0.5 MG tablet Take 1 tablet (0.5 mg total) by mouth every 8 (eight) hours. 20 tablet 0   Misc Natural Products (IMMUNE FORMULA PO) Take by mouth. W/ Elderberry  and Vitamin C     Multiple Vitamin (MULTIVITAMIN) tablet Take 1 tablet by mouth daily.     Multiple Vitamins-Minerals (ENERGY BOOSTER PO) Take by mouth. W/B12 and CoQ10     Nutritional Supplements (ESTROVEN PO) Take by mouth. Soy formula     omeprazole (PRILOSEC OTC) 20 MG tablet Take 1 tablet by mouth daily.     progesterone (PROMETRIUM) 100 MG capsule Take 1 capsule (100 mg total) by mouth daily. 90 capsule 2   No facility-administered medications prior to visit.    No Known Allergies     Objective:   Physical Exam Vitals and nursing note reviewed.  Constitutional:      General: Pt is not in acute distress.    Appearance: Normal appearance.  HENT:     Head: Normocephalic.  Pulmonary:     Effort: No respiratory distress.  Musculoskeletal:     Cervical back: Normal range of motion.  Skin:    General: Skin is dry.     Coloration: Skin is not pale.  Neurological:     Mental Status: Pt is alert and oriented to person, place, and time.  Psychiatric:  Mood and Affect: Mood normal.   There were no vitals taken for this visit.  Wt Readings from Last 3 Encounters:  06/08/23 140 lb (63.5 kg)  04/26/23 140 lb (63.5 kg)  03/29/23 140 lb (63.5 kg)      I discussed the assessment and treatment plan with the patient. The patient was provided an opportunity to ask questions and all were answered. The patient agreed with the plan and demonstrated an understanding of the instructions.   The patient was advised to call back or seek an in-person evaluation if the symptoms worsen or if the condition fails to improve as anticipated.  Dulce Sellar, NP [facility] [phone] (phone) [phone] (fax)  [facility]

## 2023-06-10 NOTE — Assessment & Plan Note (Signed)
Flying Anxiety - Managed with Lorazepam 0.5mg  as needed, primarily for travel anxiety and sleep. Patient reports needing 2 tablets to work for her. -Refill Lorazepam with higher dose to 1mg  q8h prn, as needed for anxiety and/or sleep while traveling. -Send prescription to [facility] in [place]. -F/U prn.

## 2024-01-04 ENCOUNTER — Other Ambulatory Visit: Payer: Self-pay

## 2024-01-04 DIAGNOSIS — N951 Menopausal and female climacteric states: Secondary | ICD-10-CM

## 2024-01-04 MED ORDER — PROGESTERONE MICRONIZED 100 MG PO CAPS: 100.0000 mg | ORAL_CAPSULE | Freq: Every day | ORAL | 0 refills | Status: DC

## 2024-01-04 NOTE — Telephone Encounter (Signed)
 Med requested: Progesterone  (Prometrium ) 100 mg, daily Started: 03/29/23 - #90 cap with 2 refills Last Aex: 12/31/22 Last OV: 06/08/23 Next Aex: 01/18/24 Last Mammo: 05/11/23 Refill authorized? Please advise.

## 2024-01-06 ENCOUNTER — Ambulatory Visit: Payer: Self-pay | Admitting: Obstetrics and Gynecology

## 2024-01-14 NOTE — Progress Notes (Signed)
 53 y.o. G27P2002 Married Caucasian female here for annual exam.    Followed for HRT.  No clear further spotting.   Sometimes has hot flashes and night sweats.  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   Has 2 grandchildren, one boy, one girl.  PCP: Lucius Krabbe, NP   No LMP recorded. Patient is perimenopausal.           Sexually active: Yes.    The current method of family planning is vasectomy.    Menopausal hormone therapy:  Vivelle  & progesterone   Exercising: Yes.    Walking Smoker:  no  OB History  Gravida Para Term Preterm AB Living  2 2 2   2   SAB IAB Ectopic Multiple Live Births      2    # Outcome Date GA Lbr Len/2nd Weight Sex Type Anes PTL Lv  2 Term           1 Term             Obstetric Comments  Has 2 biological children and 1 adopted child     HEALTH MAINTENANCE: Last 2 paps:  2021 per pt  History of abnormal Pap or positive HPV:  no Mammogram:   05/11/23 L Breast- Breast Density Cat D, BIRADS Cat 1 neg  Colonoscopy:  Cologuard 11/10/21 - normal Bone Density:  n/a  Result  n/a   Immunization History  Administered Date(s) Administered   Zoster Recombinant(Shingrix ) 06/20/2021, 10/10/2021      reports that she has never smoked. She has been exposed to tobacco smoke. She has never used smokeless tobacco. She reports that she does not drink alcohol and does not use drugs.  Past Medical History:  Diagnosis Date   Allergy    GERD (gastroesophageal reflux disease)     History reviewed. No pertinent surgical history.  Current Outpatient Medications  Medication Sig Dispense Refill   estradiol  (VIVELLE -DOT) 0.025 MG/24HR Place 1 patch onto the skin 2 (two) times a week. 24 patch 2   Fexofenadine HCl (ALLEGRA PO) Take by mouth.     LORazepam  (ATIVAN ) 1 MG tablet Take 1 tablet (1 mg total) by mouth every 8 (eight) hours as needed for anxiety (Related to flying.). 20 tablet 0   Misc Natural Products (IMMUNE FORMULA PO) Take by mouth. W/  Elderberry  and Vitamin C     Multiple Vitamin (MULTIVITAMIN) tablet Take 1 tablet by mouth daily.     Multiple Vitamins-Minerals (ENERGY BOOSTER PO) Take by mouth. W/B12 and CoQ10     Nutritional Supplements (ESTROVEN PO) Take by mouth. Soy formula     progesterone  (PROMETRIUM ) 100 MG capsule Take 1 capsule (100 mg total) by mouth daily. 30 capsule 0   omeprazole (PRILOSEC OTC) 20 MG tablet Take 1 tablet by mouth daily. (Patient not taking: Reported on 01/18/2024)     No current facility-administered medications for this visit.    ALLERGIES: Patient has no known allergies.  Family History  Problem Relation Age of Onset   Stroke Mother    Arthritis Mother    Drug abuse Father    Emphysema Father    Asthma Sister    Mental illness Sister    Drug abuse Brother    Depression Brother    Hypertension Maternal Grandmother    Heart disease Maternal Grandmother    Early death Maternal Grandmother    Cancer Maternal Grandfather        Throat   Hearing loss Paternal Grandmother  Early death Paternal Grandfather    Emphysema Paternal Grandfather    Breast cancer Neg Hx     Review of Systems  All other systems reviewed and are negative.   PHYSICAL EXAM:  BP 114/72 (BP Location: Left Arm, Patient Position: Sitting)   Pulse 65   Ht 5' 6.75 (1.695 m)   Wt 138 lb (62.6 kg)   SpO2 99%   BMI 21.78 kg/m     General appearance: alert, cooperative and appears stated age Head: normocephalic, without obvious abnormality, atraumatic Neck: no adenopathy, supple, symmetrical, trachea midline and thyroid normal to inspection and palpation Lungs: clear to auscultation bilaterally Breasts: normal appearance, no masses or tenderness, No nipple retraction or dimpling, No nipple discharge or bleeding, No axillary adenopathy Heart: regular rate and rhythm Abdomen: soft, non-tender; no masses, no organomegaly Extremities: extremities normal, atraumatic, no cyanosis or edema Skin: skin color,  texture, turgor normal. No rashes or lesions Lymph nodes: cervical, supraclavicular, and axillary nodes normal. Neurologic: grossly normal  Pelvic: External genitalia:  no lesions              No abnormal inguinal nodes palpated.              Urethra:  normal appearing urethra with no masses, tenderness or lesions              Bartholins and Skenes: normal                 Vagina: normal appearing vagina with normal color and discharge, no lesions              Cervix: no lesions              Pap taken: yes Bimanual Exam:  Uterus:  normal size, contour, position, consistency, mobility, non-tender              Adnexa: no mass, fullness, tenderness              Rectal exam: yes.  Confirms.              Anus:  normal sphincter tone, no lesions  Chaperone was present for exam:  Kari HERO, CMA  ASSESSMENT: Well woman visit with gynecologic exam. HRT. Menopausal symptoms.  Cervical cancer screening.  PHQ-2-9: 0  PLAN: Mammogram screening discussed. Self breast awareness reviewed. Pap and HRV collected:  yes Guidelines for Calcium, Vitamin D, regular exercise program including cardiovascular and weight bearing exercise. Discused WHI and use of HRT which can increase risk of PE, DVT, MI, stroke and breast cancer.  Benefits of treating vasomotor symptoms, improving sleep quality, and reducing risk of osteoporosis also reviewed.  Medication refills:  Will increase Vivelle  Dot to 0.0375 mg twice weekly and continue Prometrium  100 mg q hs, each for one year.   Patient will watch for potential increased headaches on the increased estradiol  dosage.  Labs with PCP.  Follow up:  yearly and prn.

## 2024-01-18 ENCOUNTER — Ambulatory Visit (INDEPENDENT_AMBULATORY_CARE_PROVIDER_SITE_OTHER): Payer: Self-pay | Admitting: Obstetrics and Gynecology

## 2024-01-18 ENCOUNTER — Other Ambulatory Visit (HOSPITAL_COMMUNITY)
Admission: RE | Admit: 2024-01-18 | Discharge: 2024-01-18 | Disposition: A | Discharge status: Home / Self Care | Payer: Self-pay | Source: Ambulatory Visit | Attending: Obstetrics and Gynecology | Admitting: Obstetrics and Gynecology

## 2024-01-18 ENCOUNTER — Encounter: Payer: Self-pay | Admitting: Obstetrics and Gynecology

## 2024-01-18 VITALS — BP 114/72 | HR 65 | Ht 66.75 in | Wt 138.0 lb

## 2024-01-18 DIAGNOSIS — N951 Menopausal and female climacteric states: Secondary | ICD-10-CM

## 2024-01-18 DIAGNOSIS — Z1331 Encounter for screening for depression: Secondary | ICD-10-CM

## 2024-01-18 DIAGNOSIS — Z01419 Encounter for gynecological examination (general) (routine) without abnormal findings: Secondary | ICD-10-CM

## 2024-01-18 DIAGNOSIS — Z124 Encounter for screening for malignant neoplasm of cervix: Secondary | ICD-10-CM

## 2024-01-18 MED ORDER — ESTRADIOL 0.0375 MG/24HR TD PTTW: 1.0000 | MEDICATED_PATCH | TRANSDERMAL | 11 refills | Status: AC

## 2024-01-18 MED ORDER — PROGESTERONE MICRONIZED 100 MG PO CAPS
100.0000 mg | ORAL_CAPSULE | Freq: Every day | ORAL | 11 refills | Status: AC
Start: 2024-01-18 — End: ?

## 2024-01-18 NOTE — Patient Instructions (Signed)

## 2024-01-21 LAB — CYTOLOGY - PAP
Comment: NEGATIVE
Diagnosis: UNDETERMINED — AB
High risk HPV: NEGATIVE

## 2024-01-25 ENCOUNTER — Ambulatory Visit: Payer: Self-pay | Admitting: Obstetrics and Gynecology

## 2025-01-18 ENCOUNTER — Ambulatory Visit: Payer: Self-pay | Admitting: Obstetrics and Gynecology
